# Patient Record
Sex: Female | Born: 1960 | Race: White | Hispanic: No | Marital: Single | Smoking: Never smoker
Health system: Southern US, Community
[De-identification: ages and names within clinical notes are randomized; demographics above are authoritative.]

## PROBLEM LIST (undated history)

## (undated) DIAGNOSIS — M545 Low back pain, unspecified: Secondary | ICD-10-CM

## (undated) DIAGNOSIS — G8929 Other chronic pain: Secondary | ICD-10-CM

## (undated) HISTORY — PX: ABDOMINAL HYSTERECTOMY: SHX81

---

## 2007-10-20 ENCOUNTER — Encounter: Admission: RE | Admit: 2007-10-20 | Discharge: 2007-10-20 | Payer: Self-pay | Admitting: Gastroenterology

## 2007-10-30 ENCOUNTER — Ambulatory Visit: Payer: Self-pay | Admitting: Surgery

## 2007-11-06 ENCOUNTER — Ambulatory Visit: Payer: Self-pay | Admitting: Surgery

## 2007-11-16 ENCOUNTER — Ambulatory Visit (HOSPITAL_COMMUNITY): Admission: RE | Admit: 2007-11-16 | Discharge: 2007-11-16 | Payer: Self-pay | Admitting: Obstetrics and Gynecology

## 2009-07-10 ENCOUNTER — Emergency Department (HOSPITAL_BASED_OUTPATIENT_CLINIC_OR_DEPARTMENT_OTHER): Admission: EM | Admit: 2009-07-10 | Discharge: 2009-07-10 | Payer: Self-pay | Admitting: Emergency Medicine

## 2010-12-15 NOTE — Assessment & Plan Note (Signed)
OFFICE VISIT   GEARLINE, SPILMAN  DOB:  15-Feb-1961                                       10/30/2007  [id number]#:[id number]   REASON FOR VISIT:  Bilateral leg swelling.   This is a 50 year old female seen at the request of Dr. Billy Coast for  evaluation of bilateral lower extremity swelling.  The patient states  that for approximately 1 month she was having bilateral leg swelling.  This initially began with severe swelling in her right ankle and now  also is in the left leg with pain in the left calf.  She says that  elevation does help.  She describes an occasional shooting pain.  She  has gotten to where she cannot tolerate any form of clothing around her  legs including socks.  She does not have a history of DVT, she has never  had a pulmonary embolism.  She does not have a family history of DVT,  although she states her sister, who smokes, may have had a blood clot.  Otherwise, she is doing well.  She does not have any ulcers on her legs.   REVIEW OF SYSTEMS:  GENERAL:  Positive for weight gain.  CARDIAC:  Occasional chest tightness.  PULMONARY:  Negative.  GI:  Positive for blood in her stool and reflux and diarrhea.  GU:  Negative.  VASCULAR:  Pain when lying flat.  NEURO:  Positive for dizziness and headaches.  ORTHO:  Negative.  PSYCH:  Negative.  ENT:  Negative.  HEME:  Positive for anemia.   PAST MEDICAL HISTORY:  Negative.   FAMILY HISTORY:  Noncontributory.   SOCIAL HISTORY:  1. She is single.  2. Works in administration.  3. She does not smoke, she has never smoked.  4. She does not drink.   MEDICATIONS:  Zyrtec, Xifaxan and Align.   ALLERGIES:  1. CODEINE.  2. AMPICILLIN.  3. SULFA.   PHYSICAL EXAMINATION:  Vital Signs:  Heart rate 102, blood pressure  121/76, respirations 16.  General:  She is well-appearing, no acute  distress.  She is normocephalic, atraumatic.  Cardiovascular:  Regular  rate and rhythm.  Pulmonary:   Respirations are nonlabored.  Abdomen:  Soft.  Extremities:  Minimal edema at this time, no ulceration, palpable  pedal pulses.  Psych:  She is alert and oriented x3.  Skin:  Without  rash.   DIAGNOSTIC STUDIES:  The patient had Duplex evaluation today.  This  reveals no evidence of deep venous thrombosis bilaterally.  Patient does  have superficial venous thrombosis in the left greater saphenous vein  from the ankle to the proximal calf.  This is of undetermined age.   ASSESSMENT AND PLAN:  Bilateral leg swelling and left leg superficial  thrombosis.   Plan at this time:  I will have the patient follow up in 1 week to  determine if she has had propagation of her superficial venous  thrombosis.  If she has, I will place her on anticoagulation at that  time.  Also, in 1 week I will evaluate her with a reflux study to see if  she would be a candidate for saphenous vein ablation.  She is going to  follow up with me in 1 week.   Jorge Ny, MD  Electronically Signed   VWB/MEDQ  D:  10/30/2007  T:  10/31/2007  Job:  [id number]   cc:   Lenoard Aden, M.D.

## 2010-12-15 NOTE — Procedures (Signed)
DUPLEX DEEP VENOUS EXAM - LOWER EXTREMITY   INDICATION:  Bilateral lower extremity edema, right greater than left.  Patient complains of transient swelling of her calves and feet  bilaterally for approximately one month, which is somewhat relieved by  elevation.  Patient states that she has restless legs syndrome.   HISTORY:  Edema:  Yes.  Trauma/Surgery:  No.  Pain:  Occasional shooting pain down her legs.  Throbbing in her ankles.  PE:  No.  Previous DVT:  No.  Anticoagulants:  Other:   DUPLEX EXAM:                CFV   SFV   PopV  PTV    GSV                R  L  R  L  R  L  R   L  R  L  Thrombosis    o  o  o  o  o  o  o   o  o  +  Spontaneous   +  +  +  +  +  +  +   +  +  o  Phasic        +  +  +  +  +  +  +   +  +  o  Augmentation  +  +  +  +  +  +  +   +  +  o  Compressible  +  +  +  +  +  +  +   +  +  o  Competent   Legend:  + - yes  o - no  p - partial  D - decreased   IMPRESSION:  1. No evidence of deep venous thrombosis bilaterally.  2. Bilateral bifed superficial femoral vein without thrombus noted.  3. SVT noted in left greater saphenous vein from the level of the      ankle to the proximal calf of mixed age.  4. A focal thrombus in left lesser saphenous vein at the level of the      mid calf of undeterminable age.    _____________________________  V. Charlena Cross, MD   PB/MEDQ  D:  10/30/2007  T:  10/30/2007  Job:  [id number]

## 2010-12-15 NOTE — Procedures (Signed)
LOWER EXTREMITY VENOUS REFLUX EXAM   INDICATION:  Followup, left greater and lesser saphenous vein thrombus.   EXAM:  Using color-flow imaging and pulse Doppler spectral analysis, the  right and left common femoral, superficial femoral, popliteal, posterior  tibial, greater and lesser saphenous veins are evaluated.  There is no  evidence suggesting deep venous insufficiency in the right and left  lower extremity.   The right and left saphenofemoral junction is competent.  The right and  left GSV is competent with the caliber as described below.   The right and left proximal short saphenous vein demonstrates  competency.   GSV Diameter (used if found to be incompetent only)                                            Right    Left  Proximal Greater Saphenous Vein           cm       cm  Proximal-to-mid-thigh                     cm       cm  Mid thigh                                 cm       cm  Mid-distal thigh                          cm       cm  Distal thigh                              cm       cm  Knee                                      cm       cm   IMPRESSION:  1. The right and left greater saphenous veins are not aneurysmal.  2. The right and left greater saphenous veins are not tortuous.  3. The deep venous system is competent.  4. The right and left lesser saphenous veins are competent.  5. Thrombus is noted in the proximal calf portion of the greater      saphenous vein extending into the popliteal area.  6. Short saphenous vein thrombus is noted in the proximal calf, which      is unchanged from 10/30/07.      ___________________________________________  V. Charlena Cross, MD   DP/MEDQ  D:  11/06/2007  T:  11/06/2007  Job:  [id number]

## 2010-12-15 NOTE — Assessment & Plan Note (Signed)
OFFICE VISIT   Nicole [name], Nicole [name]  DOB:  Oct 21, 1960                                       11/06/2007  [id number]#:[id number]   REASON FOR VISIT:  Followup.   HISTORY:  This is a 50 year old female that is seen for evaluation of  bilateral lower extremity swelling.  She had a duplex that revealed no  evidence of deep venous thrombosis, but she did have saphenous vein  thrombosis in the left greater saphenous from the ankle to proximal  calf.  I had her come back to see me to make sure there had been no  thrombus propagation.  She has been doing well in the interim.  There  have been no acute changes.   PHYSICAL EXAMINATION:  Blood pressure is 118/81, pulse 74, respirations  16.  In general, she is in no acute distress.  There is no evidence of  erythema on her legs.  There is minimal swelling.  There are no  prominent varicosities.   DIAGNOSTIC STUDIES:  Duplex was repeated.  There is no evidence of DVT.  There is bilateral superficial femoral vein thrombus, which has not  propagated.  There is a focal thrombus in the left lesser saphenous vein  at the mid calf, which was present previously.   ASSESSMENT/PLAN:  Superficial venous thrombosis:  Given that this is not  propagated, I do not see any reason to place her on anticoagulation at  this time.  Given that she is having some trouble from swelling, I have  had her fitted for compression stockings, which will be thigh-high, for  her to wear.  I will have her follow up with me in three months to see  how she is doing.   Jorge Ny, MD  Electronically Signed   VWB/MEDQ  D:  11/06/2007  T:  11/07/2007  Job:  [id number]   cc:   Anselmo Rod, M.D.

## 2015-04-16 ENCOUNTER — Telehealth: Payer: Self-pay | Admitting: Podiatry

## 2015-04-16 NOTE — Telephone Encounter (Signed)
I left message for pt. To call to schedule appt

## 2015-04-22 ENCOUNTER — Ambulatory Visit: Payer: Self-pay | Admitting: Podiatry

## 2015-04-22 ENCOUNTER — Ambulatory Visit (INDEPENDENT_AMBULATORY_CARE_PROVIDER_SITE_OTHER): Payer: Managed Care, Other (non HMO)

## 2015-04-22 ENCOUNTER — Encounter: Payer: Self-pay | Admitting: Podiatry

## 2015-04-22 ENCOUNTER — Ambulatory Visit (INDEPENDENT_AMBULATORY_CARE_PROVIDER_SITE_OTHER): Payer: Managed Care, Other (non HMO) | Admitting: Podiatry

## 2015-04-22 VITALS — BP 131/81 | HR 90 | Resp 16

## 2015-04-22 DIAGNOSIS — M79672 Pain in left foot: Secondary | ICD-10-CM

## 2015-04-22 DIAGNOSIS — M79671 Pain in right foot: Secondary | ICD-10-CM | POA: Diagnosis not present

## 2015-04-22 DIAGNOSIS — M5410 Radiculopathy, site unspecified: Secondary | ICD-10-CM | POA: Diagnosis not present

## 2015-04-22 DIAGNOSIS — M722 Plantar fascial fibromatosis: Secondary | ICD-10-CM

## 2015-04-22 MED ORDER — METHYLPREDNISOLONE 4 MG PO TBPK
ORAL_TABLET | ORAL | Status: DC
Start: 2015-04-22 — End: 2015-05-20

## 2015-04-22 NOTE — Patient Instructions (Signed)

## 2015-04-22 NOTE — Progress Notes (Signed)
   Subjective:    Patient ID: Nicole [name], female    DOB: 1961-01-14, 54 y.o.   MRN: [id number]  HPI: She presents today with several weeks' duration of pain to her left heel. She states that she is starting to feel numbness and pain to her left plantar heel which is reminiscent of plantar fasciitis which she had before. She is very concerned about another issue where she wakes up in the mornings and has severe numbness from the plantar aspect of the tip of her toes to her ankles. She states that it feels like she is walking on [place] blocks and it is very uncomfortable and very painful. She states that this is equal and symmetrical bilateral. She states that he recently started after she has developed more pain in her back. She states that I have tried going back to my Lidoderm patch tramadol and meloxicam with little help.    Review of Systems  Constitutional: Positive for diaphoresis and fatigue.  HENT: Positive for sinus pressure.   Cardiovascular: Positive for leg swelling.  Neurological: Positive for dizziness and headaches.  All other systems reviewed and are negative.      Objective:   Physical Exam: 54 year old female vital signs stable alert and oriented 3 pulses are strongly palpable. Neurologic sensorium is intact per Semmes-Weinstein monofilament deep tendon reflexes are intact bilateral and muscle strength +5 over 5 dorsiflexion plantar flexors and inverters everters all intrinsic musculature is intact. Orthopedic evaluation demonstrates all joints distal to the ankle for range of motion without crepitation. She has pain on palpation medial calcaneal tubercle of her left heel. Radiographs of the left foot demonstrates a soft tissue increase in density at the plantar fascial calcaneal insertion site of the left heel. Cutaneous evaluation demonstrates supple well-hydrated cutis no erythema edema cellulitis drainage or odor.      Assessment & Plan:  Plantar fasciitis left foot.  Neuropathy or radiculopathy associated with back issues most likely resulting in the early morning numbness of her feet.  Plan: Discussed etiology pathology conservative versus surgical therapies. Placed her on a sterilely Dosepak to be followed by meloxicam. Injected her left heel today put her in a plantar fascial brace and a night splint. She will continue the use of her other medications as needed. I did suggest that she follow up with her neurosurgeon as soon as possible. I will follow up with her in 1 month.

## 2015-05-20 ENCOUNTER — Encounter: Payer: Self-pay | Admitting: Podiatry

## 2015-05-20 ENCOUNTER — Ambulatory Visit (INDEPENDENT_AMBULATORY_CARE_PROVIDER_SITE_OTHER): Payer: Managed Care, Other (non HMO) | Admitting: Podiatry

## 2015-05-20 VITALS — BP 129/87 | HR 88 | Resp 16

## 2015-05-20 DIAGNOSIS — M5410 Radiculopathy, site unspecified: Secondary | ICD-10-CM

## 2015-05-20 DIAGNOSIS — M722 Plantar fascial fibromatosis: Secondary | ICD-10-CM

## 2015-05-20 NOTE — Progress Notes (Signed)
She presents today for follow-up of her radiculopathy plantar fasciitis left foot. She states that she is a proximal 80% improved.  Objective: Vital signs are stable alert and oriented 3. Pulses are strongly palpable. Neurologic sensorium is intact per Semmes-Weinstein monofilament. She has pain on palpation medial calcaneal tubercle.  Assessment: Plantar fasciitis left.  Plan: Discussed etiology pathology conservative versus surgical therapies. I reinjected the left heel today encouraged her to continue to wear try to wear her night splint that she has not been wearing it. I encouraged her to continue all of her anti-inflammatories and we'll follow-up with her in 1 month.

## 2016-04-19 DIAGNOSIS — F411 Generalized anxiety disorder: Secondary | ICD-10-CM | POA: Insufficient documentation

## 2016-04-19 DIAGNOSIS — G4700 Insomnia, unspecified: Secondary | ICD-10-CM | POA: Insufficient documentation

## 2016-04-20 DIAGNOSIS — M5416 Radiculopathy, lumbar region: Secondary | ICD-10-CM | POA: Insufficient documentation

## 2016-04-20 DIAGNOSIS — M461 Sacroiliitis, not elsewhere classified: Secondary | ICD-10-CM | POA: Insufficient documentation

## 2016-04-20 DIAGNOSIS — M519 Unspecified thoracic, thoracolumbar and lumbosacral intervertebral disc disorder: Secondary | ICD-10-CM | POA: Insufficient documentation

## 2016-07-01 DIAGNOSIS — F32A Depression, unspecified: Secondary | ICD-10-CM | POA: Insufficient documentation

## 2016-10-17 ENCOUNTER — Emergency Department (HOSPITAL_BASED_OUTPATIENT_CLINIC_OR_DEPARTMENT_OTHER)
Admission: EM | Admit: 2016-10-17 | Discharge: 2016-10-17 | Disposition: A | Discharge status: Home / Self Care | Payer: 59 | Attending: Emergency Medicine | Admitting: Emergency Medicine

## 2016-10-17 ENCOUNTER — Encounter (HOSPITAL_BASED_OUTPATIENT_CLINIC_OR_DEPARTMENT_OTHER): Payer: Self-pay | Admitting: Emergency Medicine

## 2016-10-17 ENCOUNTER — Emergency Department (HOSPITAL_BASED_OUTPATIENT_CLINIC_OR_DEPARTMENT_OTHER): Payer: 59

## 2016-10-17 DIAGNOSIS — M25551 Pain in right hip: Secondary | ICD-10-CM | POA: Insufficient documentation

## 2016-10-17 DIAGNOSIS — M545 Low back pain, unspecified: Secondary | ICD-10-CM

## 2016-10-17 DIAGNOSIS — Y999 Unspecified external cause status: Secondary | ICD-10-CM | POA: Insufficient documentation

## 2016-10-17 DIAGNOSIS — Y9389 Activity, other specified: Secondary | ICD-10-CM | POA: Insufficient documentation

## 2016-10-17 DIAGNOSIS — Y92410 Unspecified street and highway as the place of occurrence of the external cause: Secondary | ICD-10-CM | POA: Insufficient documentation

## 2016-10-17 DIAGNOSIS — S3992XA Unspecified injury of lower back, initial encounter: Secondary | ICD-10-CM | POA: Diagnosis present

## 2016-10-17 HISTORY — DX: Low back pain: M54.5

## 2016-10-17 HISTORY — DX: Low back pain, unspecified: M54.50

## 2016-10-17 HISTORY — DX: Other chronic pain: G89.29

## 2016-10-17 MED ORDER — METHOCARBAMOL 500 MG PO TABS
500.0000 mg | ORAL_TABLET | Freq: Once | ORAL | Status: AC
  Administered 2016-10-17: 500 mg via ORAL
  Filled 2016-10-17: qty 1

## 2016-10-17 MED ORDER — TRAMADOL HCL 50 MG PO TABS: 50.0000 mg | ORAL_TABLET | Freq: Four times a day (QID) | ORAL | 0 refills | Status: DC | PRN

## 2016-10-17 MED ORDER — TRAMADOL HCL 50 MG PO TABS
50.0000 mg | ORAL_TABLET | Freq: Once | ORAL | Status: AC
  Administered 2016-10-17: 50 mg via ORAL
  Filled 2016-10-17: qty 1

## 2016-10-17 MED ORDER — METHOCARBAMOL 500 MG PO TABS: 500.0000 mg | ORAL_TABLET | Freq: Two times a day (BID) | ORAL | 0 refills | Status: AC

## 2016-10-17 NOTE — ED Notes (Signed)
Pt given d/c instructions as per chart. Verbalizes understanding. No questions. Rx x 2 with precautions. 
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
## 2018-07-03 DIAGNOSIS — M461 Sacroiliitis, not elsewhere classified: Secondary | ICD-10-CM | POA: Insufficient documentation

## 2018-08-06 ENCOUNTER — Observation Stay
Admission: EM | Admit: 2018-08-06 | Discharge: 2018-08-07 | Disposition: A | Discharge status: Home / Self Care | Payer: BLUE CROSS/BLUE SHIELD | Attending: General Surgery | Admitting: General Surgery

## 2018-08-06 ENCOUNTER — Emergency Department: Payer: BLUE CROSS/BLUE SHIELD

## 2018-08-06 ENCOUNTER — Encounter
Admission: EM | Disposition: A | Discharge status: Home / Self Care | Payer: Self-pay | Source: Home / Self Care | Attending: Emergency Medicine

## 2018-08-06 ENCOUNTER — Observation Stay: Payer: BLUE CROSS/BLUE SHIELD | Admitting: Anesthesiology

## 2018-08-06 ENCOUNTER — Encounter: Payer: Self-pay | Admitting: Radiology

## 2018-08-06 ENCOUNTER — Other Ambulatory Visit: Payer: Self-pay

## 2018-08-06 DIAGNOSIS — K352 Acute appendicitis with generalized peritonitis, without abscess: Principal | ICD-10-CM | POA: Insufficient documentation

## 2018-08-06 DIAGNOSIS — Z885 Allergy status to narcotic agent status: Secondary | ICD-10-CM | POA: Insufficient documentation

## 2018-08-06 DIAGNOSIS — M5126 Other intervertebral disc displacement, lumbar region: Secondary | ICD-10-CM | POA: Insufficient documentation

## 2018-08-06 DIAGNOSIS — Z791 Long term (current) use of non-steroidal anti-inflammatories (NSAID): Secondary | ICD-10-CM | POA: Insufficient documentation

## 2018-08-06 DIAGNOSIS — K219 Gastro-esophageal reflux disease without esophagitis: Secondary | ICD-10-CM | POA: Diagnosis not present

## 2018-08-06 DIAGNOSIS — Z79899 Other long term (current) drug therapy: Secondary | ICD-10-CM | POA: Diagnosis not present

## 2018-08-06 DIAGNOSIS — Z90710 Acquired absence of both cervix and uterus: Secondary | ICD-10-CM | POA: Diagnosis not present

## 2018-08-06 DIAGNOSIS — Z880 Allergy status to penicillin: Secondary | ICD-10-CM | POA: Insufficient documentation

## 2018-08-06 DIAGNOSIS — K3580 Unspecified acute appendicitis: Secondary | ICD-10-CM | POA: Diagnosis present

## 2018-08-06 HISTORY — PX: LAPAROSCOPIC APPENDECTOMY: SHX408

## 2018-08-06 LAB — COMPREHENSIVE METABOLIC PANEL
ALBUMIN: 4.4 g/dL (ref 3.5–5.0)
ALK PHOS: 77 U/L (ref 38–126)
ALT: 15 U/L (ref 0–44)
ANION GAP: 8 (ref 5–15)
AST: 15 U/L (ref 15–41)
BUN: 13 mg/dL (ref 6–20)
CHLORIDE: 100 mmol/L (ref 98–111)
CO2: 26 mmol/L (ref 22–32)
Calcium: 9.3 mg/dL (ref 8.9–10.3)
Creatinine, Ser: 0.78 mg/dL (ref 0.44–1.00)
GFR calc non Af Amer: >60 mL/min (ref >60)
GLUCOSE: 163 mg/dL — AB (ref 70–99)
POTASSIUM: 3.5 mmol/L (ref 3.5–5.1)
SODIUM: 134 mmol/L — AB (ref 135–145)
Total Bilirubin: 1.4 mg/dL — ABNORMAL HIGH (ref 0.3–1.2)
Total Protein: 7.2 g/dL (ref 6.5–8.1)

## 2018-08-06 LAB — URINALYSIS, COMPLETE (UACMP) WITH MICROSCOPIC
BACTERIA UA: NONE SEEN
Bilirubin Urine: NEGATIVE
Glucose, UA: NEGATIVE mg/dL
Hgb urine dipstick: NEGATIVE
KETONES UR: NEGATIVE mg/dL
LEUKOCYTES UA: NEGATIVE
NITRITE: NEGATIVE
PROTEIN: NEGATIVE mg/dL
Specific Gravity, Urine: 1.011 (ref 1.005–1.030)
pH: 7 (ref 5.0–8.0)

## 2018-08-06 LAB — CBC
HEMATOCRIT: 46.6 % — AB (ref 36.0–46.0)
Hemoglobin: 16.5 g/dL — ABNORMAL HIGH (ref 12.0–15.0)
MCH: 31.2 pg (ref 26.0–34.0)
MCHC: 35.4 g/dL (ref 30.0–36.0)
MCV: 88.1 fL (ref 80.0–100.0)
NRBC: 0 % (ref 0.0–0.2)
Platelets: 201 10*3/uL (ref 150–400)
RBC: 5.29 MIL/uL — AB (ref 3.87–5.11)
RDW: 12 % (ref 11.5–15.5)
WBC: 22.3 10*3/uL — ABNORMAL HIGH (ref 4.0–10.5)

## 2018-08-06 LAB — SURGICAL PCR SCREEN
MRSA, PCR: NEGATIVE
Staphylococcus aureus: NEGATIVE

## 2018-08-06 LAB — LIPASE, BLOOD: Lipase: 24 U/L (ref 11–51)

## 2018-08-06 SURGERY — APPENDECTOMY, LAPAROSCOPIC
Anesthesia: General

## 2018-08-06 MED ORDER — DEXAMETHASONE SODIUM PHOSPHATE 10 MG/ML IJ SOLN
INTRAMUSCULAR | Status: AC
  Filled 2018-08-06: qty 1

## 2018-08-06 MED ORDER — LIDOCAINE HCL (PF) 2 % IJ SOLN
INTRAMUSCULAR | Status: AC
  Filled 2018-08-06: qty 10

## 2018-08-06 MED ORDER — CIPROFLOXACIN IN D5W 400 MG/200ML IV SOLN
400.0000 mg | INTRAVENOUS | Status: AC
  Administered 2018-08-06: 400 mg via INTRAVENOUS
  Filled 2018-08-06: qty 200

## 2018-08-06 MED ORDER — IOPAMIDOL (ISOVUE-300) INJECTION 61%
100.0000 mL | Freq: Once | INTRAVENOUS | Status: AC | PRN
  Administered 2018-08-06: 100 mL via INTRAVENOUS

## 2018-08-06 MED ORDER — DEXAMETHASONE SODIUM PHOSPHATE 10 MG/ML IJ SOLN
INTRAMUSCULAR | Status: DC | PRN
  Administered 2018-08-06: 10 mg via INTRAVENOUS

## 2018-08-06 MED ORDER — HYDROMORPHONE HCL 1 MG/ML IJ SOLN
0.5000 mg | INTRAMUSCULAR | Status: DC | PRN
  Administered 2018-08-06 (×2): 0.5 mg via INTRAVENOUS
  Filled 2018-08-06 (×2): qty 0.5

## 2018-08-06 MED ORDER — ONDANSETRON HCL 4 MG/2ML IJ SOLN
4.0000 mg | Freq: Four times a day (QID) | INTRAMUSCULAR | Status: DC | PRN
  Administered 2018-08-06 (×2): 4 mg via INTRAVENOUS
  Filled 2018-08-06 (×2): qty 2

## 2018-08-06 MED ORDER — OXYCODONE HCL 5 MG PO TABS
5.0000 mg | ORAL_TABLET | ORAL | Status: DC | PRN
  Administered 2018-08-07: 5 mg via ORAL
  Filled 2018-08-06: qty 1

## 2018-08-06 MED ORDER — FENTANYL CITRATE (PF) 100 MCG/2ML IJ SOLN
INTRAMUSCULAR | Status: AC
  Filled 2018-08-06: qty 2

## 2018-08-06 MED ORDER — SODIUM CHLORIDE 0.9 % IV BOLUS
1000.0000 mL | Freq: Once | INTRAVENOUS | Status: AC
  Administered 2018-08-06: 1000 mL via INTRAVENOUS

## 2018-08-06 MED ORDER — LIDOCAINE-EPINEPHRINE (PF) 1 %-1:200000 IJ SOLN
INTRAMUSCULAR | Status: AC
  Filled 2018-08-06: qty 30

## 2018-08-06 MED ORDER — IOPAMIDOL (ISOVUE-300) INJECTION 61%
30.0000 mL | Freq: Once | INTRAVENOUS | Status: AC | PRN
  Administered 2018-08-06: 30 mL via ORAL

## 2018-08-06 MED ORDER — LIDOCAINE HCL (CARDIAC) PF 100 MG/5ML IV SOSY
PREFILLED_SYRINGE | INTRAVENOUS | Status: DC | PRN
  Administered 2018-08-06: 100 mg via INTRAVENOUS

## 2018-08-06 MED ORDER — PROMETHAZINE HCL 25 MG/ML IJ SOLN: 6.2500 mg | INTRAMUSCULAR | Status: DC | PRN

## 2018-08-06 MED ORDER — SUCCINYLCHOLINE CHLORIDE 20 MG/ML IJ SOLN
INTRAMUSCULAR | Status: DC | PRN
  Administered 2018-08-06: 100 mg via INTRAVENOUS

## 2018-08-06 MED ORDER — PROMETHAZINE HCL 25 MG/ML IJ SOLN
12.5000 mg | Freq: Four times a day (QID) | INTRAMUSCULAR | Status: DC | PRN
  Administered 2018-08-06: 12.5 mg via INTRAVENOUS
  Filled 2018-08-06: qty 1

## 2018-08-06 MED ORDER — METRONIDAZOLE IN NACL 5-0.79 MG/ML-% IV SOLN
500.0000 mg | INTRAVENOUS | Status: AC
  Administered 2018-08-06: 500 mg via INTRAVENOUS
  Filled 2018-08-06: qty 100

## 2018-08-06 MED ORDER — ACETAMINOPHEN 10 MG/ML IV SOLN
INTRAVENOUS | Status: DC | PRN
  Administered 2018-08-06: 1000 mg via INTRAVENOUS

## 2018-08-06 MED ORDER — MIDAZOLAM HCL 2 MG/2ML IJ SOLN
INTRAMUSCULAR | Status: DC | PRN
  Administered 2018-08-06: 2 mg via INTRAVENOUS

## 2018-08-06 MED ORDER — ONDANSETRON HCL 4 MG/2ML IJ SOLN
INTRAMUSCULAR | Status: AC
  Filled 2018-08-06: qty 2

## 2018-08-06 MED ORDER — ROCURONIUM BROMIDE 50 MG/5ML IV SOLN
INTRAVENOUS | Status: AC
  Filled 2018-08-06: qty 1

## 2018-08-06 MED ORDER — FENTANYL CITRATE (PF) 100 MCG/2ML IJ SOLN
100.0000 ug | Freq: Once | INTRAMUSCULAR | Status: AC
  Administered 2018-08-06: 100 ug via INTRAVENOUS
  Filled 2018-08-06: qty 2

## 2018-08-06 MED ORDER — MUPIROCIN 2 % EX OINT
1.0000 "application " | TOPICAL_OINTMENT | Freq: Two times a day (BID) | CUTANEOUS | Status: DC
  Filled 2018-08-06: qty 22

## 2018-08-06 MED ORDER — ACETAMINOPHEN 500 MG PO TABS: 1000.0000 mg | ORAL_TABLET | Freq: Four times a day (QID) | ORAL | Status: DC | PRN

## 2018-08-06 MED ORDER — BUPIVACAINE HCL (PF) 0.25 % IJ SOLN
INTRAMUSCULAR | Status: AC
  Filled 2018-08-06: qty 30

## 2018-08-06 MED ORDER — LACTATED RINGERS IV SOLN
INTRAVENOUS | Status: DC
  Administered 2018-08-06 – 2018-08-07 (×3): via INTRAVENOUS

## 2018-08-06 MED ORDER — PROPOFOL 10 MG/ML IV BOLUS
INTRAVENOUS | Status: AC
  Filled 2018-08-06: qty 20

## 2018-08-06 MED ORDER — ROCURONIUM BROMIDE 100 MG/10ML IV SOLN
INTRAVENOUS | Status: DC | PRN
  Administered 2018-08-06: 10 mg via INTRAVENOUS
  Administered 2018-08-06: 40 mg via INTRAVENOUS
  Administered 2018-08-06: 10 mg via INTRAVENOUS

## 2018-08-06 MED ORDER — IOHEXOL 300 MG/ML  SOLN: 100.0000 mL | Freq: Once | INTRAMUSCULAR | Status: DC | PRN

## 2018-08-06 MED ORDER — FENTANYL CITRATE (PF) 100 MCG/2ML IJ SOLN
50.0000 ug | Freq: Once | INTRAMUSCULAR | Status: AC
  Administered 2018-08-06: 50 ug via INTRAVENOUS
  Filled 2018-08-06: qty 2

## 2018-08-06 MED ORDER — SUGAMMADEX SODIUM 200 MG/2ML IV SOLN
INTRAVENOUS | Status: AC
  Filled 2018-08-06: qty 2

## 2018-08-06 MED ORDER — MIDAZOLAM HCL 2 MG/2ML IJ SOLN
INTRAMUSCULAR | Status: AC
  Filled 2018-08-06: qty 2

## 2018-08-06 MED ORDER — ONDANSETRON HCL 4 MG/2ML IJ SOLN
4.0000 mg | Freq: Once | INTRAMUSCULAR | Status: AC
  Administered 2018-08-06: 4 mg via INTRAVENOUS
  Filled 2018-08-06: qty 2

## 2018-08-06 MED ORDER — ACETAMINOPHEN 10 MG/ML IV SOLN
INTRAVENOUS | Status: AC
  Filled 2018-08-06: qty 100

## 2018-08-06 MED ORDER — LIDOCAINE-EPINEPHRINE (PF) 1 %-1:200000 IJ SOLN
INTRAMUSCULAR | Status: DC | PRN
  Administered 2018-08-06: 20 mL

## 2018-08-06 MED ORDER — SUCCINYLCHOLINE CHLORIDE 20 MG/ML IJ SOLN
INTRAMUSCULAR | Status: AC
  Filled 2018-08-06: qty 1

## 2018-08-06 MED ORDER — FENTANYL CITRATE (PF) 100 MCG/2ML IJ SOLN
INTRAMUSCULAR | Status: DC | PRN
  Administered 2018-08-06 (×4): 50 ug via INTRAVENOUS

## 2018-08-06 MED ORDER — SUGAMMADEX SODIUM 200 MG/2ML IV SOLN
INTRAVENOUS | Status: DC | PRN
  Administered 2018-08-06: 180 mg via INTRAVENOUS

## 2018-08-06 MED ORDER — PROPOFOL 10 MG/ML IV BOLUS
INTRAVENOUS | Status: DC | PRN
  Administered 2018-08-06: 160 mg via INTRAVENOUS

## 2018-08-06 MED ORDER — FENTANYL CITRATE (PF) 100 MCG/2ML IJ SOLN: 25.0000 ug | INTRAMUSCULAR | Status: DC | PRN

## 2018-08-06 MED ORDER — ONDANSETRON HCL 4 MG/2ML IJ SOLN
INTRAMUSCULAR | Status: DC | PRN
  Administered 2018-08-06: 4 mg via INTRAVENOUS

## 2018-08-06 SURGICAL SUPPLY — 46 items
BLADE SURG SZ11 CARB STEEL (BLADE) ×2 IMPLANT
CANISTER SUCT 1200ML W/VALVE (MISCELLANEOUS) ×2 IMPLANT
CHLORAPREP W/TINT 26ML (MISCELLANEOUS) ×2 IMPLANT
COVER WAND RF STERILE (DRAPES) IMPLANT
CUTTER FLEX LINEAR 45M (STAPLE) ×2 IMPLANT
DEFOGGER SCOPE WARMER CLEARIFY (MISCELLANEOUS) ×2 IMPLANT
DERMABOND ADVANCED (GAUZE/BANDAGES/DRESSINGS) ×1
DERMABOND ADVANCED .7 DNX12 (GAUZE/BANDAGES/DRESSINGS) ×1 IMPLANT
ELECT CAUTERY BLADE 6.4 (BLADE) ×2 IMPLANT
ELECT CAUTERY BLADE TIP 2.5 (TIP) ×2
ELECT REM PT RETURN 9FT ADLT (ELECTROSURGICAL) ×2
ELECTRODE CAUTERY BLDE TIP 2.5 (TIP) ×1 IMPLANT
ELECTRODE REM PT RTRN 9FT ADLT (ELECTROSURGICAL) ×1 IMPLANT
GLOVE BIO SURGEON STRL SZ 6.5 (GLOVE) ×4 IMPLANT
GLOVE BIOGEL PI IND STRL 7.0 (GLOVE) ×2 IMPLANT
GLOVE BIOGEL PI INDICATOR 7.0 (GLOVE) ×2
GOWN STRL REUS W/ TWL LRG LVL3 (GOWN DISPOSABLE) ×2 IMPLANT
GOWN STRL REUS W/TWL LRG LVL3 (GOWN DISPOSABLE) ×2
IRRIGATION STRYKERFLOW (MISCELLANEOUS) ×1 IMPLANT
IRRIGATOR STRYKERFLOW (MISCELLANEOUS) ×2
IV NS 1000ML (IV SOLUTION) ×1
IV NS 1000ML BAXH (IV SOLUTION) ×1 IMPLANT
KIT TURNOVER KIT A (KITS) ×2 IMPLANT
LABEL OR SOLS (LABEL) IMPLANT
LIGASURE MARYLAND LAP STAND (ELECTROSURGICAL) IMPLANT
NEEDLE HYPO 22GX1.5 SAFETY (NEEDLE) ×2 IMPLANT
NS IRRIG 500ML POUR BTL (IV SOLUTION) ×2 IMPLANT
PACK LAP CHOLECYSTECTOMY (MISCELLANEOUS) ×2 IMPLANT
PENCIL ELECTRO HAND CTR (MISCELLANEOUS) ×2 IMPLANT
POUCH SPECIMEN RETRIEVAL 10MM (ENDOMECHANICALS) ×2 IMPLANT
RELOAD 45 VASCULAR/THIN (ENDOMECHANICALS) ×2 IMPLANT
RELOAD STAPLE TA45 3.5 REG BLU (ENDOMECHANICALS) ×2 IMPLANT
SCISSORS METZENBAUM CVD 33 (INSTRUMENTS) ×2 IMPLANT
SHEARS HARMONIC ACE PLUS 36CM (ENDOMECHANICALS) ×2 IMPLANT
SLEEVE ADV FIXATION 5X100MM (TROCAR) ×4 IMPLANT
STRIP CLOSURE SKIN 1/2X4 (GAUZE/BANDAGES/DRESSINGS) ×2 IMPLANT
SUT MNCRL 4-0 (SUTURE) ×1
SUT MNCRL 4-0 27XMFL (SUTURE) ×1
SUT VIC AB 3-0 SH 27 (SUTURE) ×1
SUT VIC AB 3-0 SH 27X BRD (SUTURE) ×1 IMPLANT
SUT VICRYL 0 AB UR-6 (SUTURE) ×2 IMPLANT
SUTURE MNCRL 4-0 27XMF (SUTURE) ×1 IMPLANT
TRAY FOLEY MTR SLVR 16FR STAT (SET/KITS/TRAYS/PACK) ×2 IMPLANT
TROCAR 130MM GELPORT  DAV (MISCELLANEOUS) ×2 IMPLANT
TROCAR Z-THREAD OPTICAL 5X100M (TROCAR) ×2 IMPLANT
TUBING INSUFFLATION (TUBING) ×2 IMPLANT

## 2018-08-06 NOTE — Progress Notes (Signed)
Dr Lady Gary notified of pt vomiting orders given.

## 2018-08-06 NOTE — Anesthesia Postprocedure Evaluation (Signed)
Anesthesia Post Note  Patient: Nicole [name]  Procedure(s) Performed: APPENDECTOMY LAPAROSCOPIC (N/A )  Patient location during evaluation: PACU Anesthesia Type: General Level of consciousness: awake and alert Pain management: pain level controlled Vital Signs Assessment: post-procedure vital signs reviewed and stable Respiratory status: spontaneous breathing, nonlabored ventilation, respiratory function stable and patient connected to nasal cannula oxygen Cardiovascular status: blood pressure returned to baseline and stable Postop Assessment: no apparent nausea or vomiting Anesthetic complications: no     Last Vitals:  Vitals:   08/06/18 1748 08/06/18 1836  BP: 112/75 133/84  Pulse: (!) 105 86  Resp: 19 16  Temp:  (!) 36.3 C  SpO2: 99% 98%    Last Pain:  Vitals:   08/06/18 1836  TempSrc: Oral  PainSc:                  Lenard Simmer

## 2018-08-06 NOTE — ED Notes (Signed)
Pt transported to room 227 

## 2018-08-06 NOTE — Anesthesia Post-op Follow-up Note (Signed)
Anesthesia QCDR form completed.        

## 2018-08-06 NOTE — Anesthesia Preprocedure Evaluation (Signed)
Anesthesia Evaluation  Patient identified by MRN, date of birth, ID band Patient awake    Reviewed: Allergy & Precautions, H&P , NPO status , Patient's Chart, lab work & pertinent test results, reviewed documented beta blocker date and time   History of Anesthesia Complications (+) PONV and history of anesthetic complications  Airway Mallampati: II  TM Distance: >3 FB Neck ROM: full    Dental  (+) Dental Advidsory Given, Caps, Missing   Pulmonary neg pulmonary ROS,           Cardiovascular Exercise Tolerance: Good negative cardio ROS       Neuro/Psych negative neurological ROS  negative psych ROS   GI/Hepatic Neg liver ROS, GERD  ,  Endo/Other  negative endocrine ROS  Renal/GU negative Renal ROS  negative genitourinary   Musculoskeletal   Abdominal   Peds  Hematology negative hematology ROS (+)   Anesthesia Other Findings Past Medical History: No date: Chronic low back pain   Reproductive/Obstetrics negative OB ROS                             Anesthesia Physical Anesthesia Plan  ASA: II  Anesthesia Plan: General   Post-op Pain Management:    Induction: Intravenous  PONV Risk Score and Plan: 4 or greater and Ondansetron, Dexamethasone, Midazolam, Promethazine and Treatment may vary due to age or medical condition  Airway Management Planned: Oral ETT  Additional Equipment:   Intra-op Plan:   Post-operative Plan: Extubation in OR  Informed Consent: I have reviewed the patients History and Physical, chart, labs and discussed the procedure including the risks, benefits and alternatives for the proposed anesthesia with the patient or authorized representative who has indicated his/her understanding and acceptance.   Dental Advisory Given  Plan Discussed with: Anesthesiologist, CRNA and Surgeon  Anesthesia Plan Comments:         Anesthesia Quick Evaluation

## 2018-08-06 NOTE — ED Triage Notes (Signed)
Pt states that her abd pain started yesterday at her epigastric area, pt reports bloating and severe pain that is stabbing and moved to her rlq

## 2018-08-06 NOTE — Anesthesia Procedure Notes (Signed)
Procedure Name: Intubation Date/Time: 08/06/2018 3:00 PM Performed by: Aline Brochure, CRNA Pre-anesthesia Checklist: Patient identified, Emergency Drugs available, Suction available and Patient being monitored Patient Re-evaluated:Patient Re-evaluated prior to induction Oxygen Delivery Method: Circle system utilized Preoxygenation: Pre-oxygenation with 100% oxygen Induction Type: IV induction Ventilation: Mask ventilation without difficulty Laryngoscope Size: Mac and 3 Grade View: Grade I Tube type: Oral Tube size: 7.0 mm Number of attempts: 1 Airway Equipment and Method: Stylet Placement Confirmation: ETT inserted through vocal cords under direct vision,  positive ETCO2 and breath sounds checked- equal and bilateral Secured at: 22 cm Tube secured with: Tape Dental Injury: Teeth and Oropharynx as per pre-operative assessment

## 2018-08-06 NOTE — ED Provider Notes (Signed)
[facility] Emergency Department Provider Note  Time seen: 8:32 AM  I have reviewed the triage vital signs and the nursing notes.   HISTORY  Chief Complaint Abdominal Pain    HPI Nicole [name] is a 58 y.o. female with a past medical history of chronic back pain, presents to the emergency department for lower abdominal pain.  According to the patient since last night she has been experiencing aching pain in her upper abdomen and mid abdomen.  However states this morning it has progressed has become more severe and has located itself in the right lower quadrant.  Denies any nausea or vomiting.  Has had loose stool since yesterday.  Denies any dysuria or hematuria, vaginal bleeding or discharge.  No known fever.  Patient's only abdominal surgery in the past is a hysterectomy.   Past Medical History:  Diagnosis Date  . Chronic low back pain     There are no active problems to display for this patient.   Past Surgical History:  Procedure Laterality Date  . ABDOMINAL HYSTERECTOMY      Prior to Admission medications   Medication Sig Start Date End Date Taking? Authorizing Provider  methocarbamol (ROBAXIN) 500 MG tablet Take 1 tablet (500 mg total) by mouth 2 (two) times daily. 10/17/16   Law, Waylan Boga, PA-C  traMADol (ULTRAM) 50 MG tablet Take 1 tablet (50 mg total) by mouth every 6 (six) hours as needed. 10/17/16   Emi Holes, PA-C    Allergies  Allergen Reactions  . Ampicillin   . Codeine   . Hydrocodone-Acetaminophen   . Penicillins     No family history on file.  Social History Social History   Tobacco Use  . Smoking status: Never Smoker  . Smokeless tobacco: Never Used  Substance Use Topics  . Alcohol use: No    Alcohol/week: 0.0 standard drinks  . Drug use: Not on file    Review of Systems Constitutional: Negative for fever. Cardiovascular: Negative for chest pain. Respiratory: Negative for shortness of  breath. Gastrointestinal: Positive for abdominal pain initially mid abdominal now right lower quadrant.  Negative for nausea vomiting.  Positive for loose stool. Genitourinary: Negative for urinary compaints Musculoskeletal: Negative for musculoskeletal complaints Skin: Negative for skin complaints  Neurological: Negative for headache All other ROS negative  ____________________________________________   PHYSICAL EXAM:  VITAL SIGNS: ED Triage Vitals [08/06/18 0810]  Enc Vitals Group     BP (!) 139/101     Pulse Rate (!) 122     Resp (!) 22     Temp 98.4 F (36.9 C)     Temp Source Oral     SpO2 100 %     Weight      Height      Head Circumference      Peak Flow      Pain Score 7     Pain Loc      Pain Edu?      Excl. in GC?    Constitutional: Alert and oriented.  Mild distress due to abdominal pain. Eyes: Normal exam ENT   Head: Normocephalic and atraumatic   Mouth/Throat: Mucous membranes are moist. Cardiovascular: Normal rate, regular rhythm. No murmur Respiratory: Normal respiratory effort without tachypnea nor retractions. Breath sounds are clear Gastrointestinal: Soft, moderate right-sided tenderness, right lower quadrant greater than right upper quadrant.  Minimal rebound tenderness.  No guarding. Musculoskeletal: Nontender with normal range of motion in all extremities. Neurologic:  Normal speech  and language. No gross focal neurologic deficits Skin:  Skin is warm, dry and intact.  Psychiatric: Mood and affect are normal.   ____________________________________________    EKG  EKG viewed and interpreted by myself shows sinus tachycardia 117 bpm with a narrow QRS, normal axis, normal intervals, no concerning ST changes.  ____________________________________________    RADIOLOGY  CT consistent with acute appendicitis without perforation or abscess.  ____________________________________________   INITIAL IMPRESSION / ASSESSMENT AND PLAN / ED  COURSE  Pertinent labs & imaging results that were available during my care of the patient were reviewed by me and considered in my medical decision making (see chart for details).  Patient presents to the emergency department for right lower quadrant abdominal pain.  Differential at this time would include appendicitis, bowel obstruction, colitis/diverticulitis, cholecystitis, urinary tract infection/pyelonephritis.  We will check labs, urinalysis, treat pain nausea, IV hydrate and obtain CT imaging of the abdomen/pelvis to further evaluate.  Patient's white blood cell count is 22,000, the remainder of the lab work is pending.  Patient CT is consistent with acute appendicitis without perforation or abscess at this time.  I discussed the patient with Dr. Lady Garyannon of general surgery.  She will be down to see the patient.  Patient states she did have a couple blueberries this morning at 6 AM but has not eaten anything since.  ____________________________________________   FINAL CLINICAL IMPRESSION(S) / ED DIAGNOSES  Acute appendicitis   Minna AntisPaduchowski, Leonarda Leis, MD 08/06/18 1029

## 2018-08-06 NOTE — Progress Notes (Signed)
Ice chips given to patient.

## 2018-08-06 NOTE — H&P (Signed)
Nicole [name] is an 58 y.o. female.   Chief Complaint: acute appendicitis HPI: She presented to the emergency this morning with abdominal pain.  She reports initial onset in the upper and mid abdomen, particularly in the periumbilical area.  This started last night.  This morning, the pain is much more severe and his located itself in the right lower quadrant.  She denies any vomiting but did state that she had some nausea.  She denies fevers or chills.  She has a previous history of a partial hysterectomy.  She states that she last ate something at about 6:00 this morning which consisted of a few blueberries and a sip of Pepsi.  Past Medical History:  Diagnosis Date  . Chronic low back pain     Past Surgical History:  Procedure Laterality Date  . ABDOMINAL HYSTERECTOMY      No family history on file. Social History:  reports that she has never smoked. She has never used smokeless tobacco. She reports that she does not drink alcohol. No history on file for drug.  Allergies:  Allergies  Allergen Reactions  . Ampicillin Anaphylaxis  . Codeine     hallucinations  . Hydrocodone-Acetaminophen Nausea And Vomiting  . Penicillins Anaphylaxis and Hives     Results for orders placed or performed during the hospital encounter of 08/06/18 (from the past 48 hour(s))  Lipase, blood     Status: None   Collection Time: 08/06/18  8:15 AM  Result Value Ref Range   Lipase 24 11 - 51 U/L    Comment: Performed at [facility], [address] [place], [place] [address]  Comprehensive metabolic panel     Status: Abnormal   Collection Time: 08/06/18  8:15 AM  Result Value Ref Range   Sodium 134 (L) 135 - 145 mmol/L   Potassium 3.5 3.5 - 5.1 mmol/L   Chloride 100 98 - 111 mmol/L   CO2 26 22 - 32 mmol/L   Glucose, Bld 163 (H) 70 - 99 mg/dL   BUN 13 6 - 20 mg/dL   Creatinine, Ser 9.560.78 0.44 - 1.00 mg/dL   Calcium 9.3 8.9 - 21.310.3 mg/dL   Total Protein 7.2 6.5 - 8.1 g/dL   Albumin 4.4  3.5 - 5.0 g/dL   AST 15 15 - 41 U/L   ALT 15 0 - 44 U/L   Alkaline Phosphatase 77 38 - 126 U/L   Total Bilirubin 1.4 (H) 0.3 - 1.2 mg/dL   GFR calc non Af Amer >60 >60 mL/min   GFR calc Af Amer >60 >60 mL/min   Anion gap 8 5 - 15    Comment: Performed at [facility], [address] [place], [place] [address]  CBC     Status: Abnormal   Collection Time: 08/06/18  8:15 AM  Result Value Ref Range   WBC 22.3 (H) 4.0 - 10.5 K/uL   RBC 5.29 (H) 3.87 - 5.11 MIL/uL   Hemoglobin 16.5 (H) 12.0 - 15.0 g/dL   HCT 84.646.6 (H) 96.236.0 - 95.246.0 %   MCV 88.1 80.0 - 100.0 fL   MCH 31.2 26.0 - 34.0 pg   MCHC 35.4 30.0 - 36.0 g/dL   RDW 84.112.0 32.411.5 - 40.115.5 %   Platelets 201 150 - 400 K/uL   nRBC 0.0 0.0 - 0.2 %    Comment: Performed at [facility], [address] [place], [place] [address]  Urinalysis, Complete w Microscopic     Status:  Abnormal   Collection Time: 08/06/18  8:15 AM  Result Value Ref Range   Color, Urine YELLOW (A) YELLOW   APPearance CLEAR (A) CLEAR   Specific Gravity, Urine 1.011 1.005 - 1.030   pH 7.0 5.0 - 8.0   Glucose, UA NEGATIVE NEGATIVE mg/dL   Hgb urine dipstick NEGATIVE NEGATIVE   Bilirubin Urine NEGATIVE NEGATIVE   Ketones, ur NEGATIVE NEGATIVE mg/dL   Protein, ur NEGATIVE NEGATIVE mg/dL   Nitrite NEGATIVE NEGATIVE   Leukocytes, UA NEGATIVE NEGATIVE   WBC, UA 0-5 0 - 5 WBC/hpf   Bacteria, UA NONE SEEN NONE SEEN   Squamous Epithelial / LPF 0-5 0 - 5    Comment: Performed at [facility], [address] [place], [place] [address]   Ct Abdomen Pelvis W Contrast  Result Date: 08/06/2018 CLINICAL DATA:  Epigastric abdominal pain starting yesterday. Bloating. Pain has migrated to the right lower quadrant. EXAM: CT ABDOMEN AND PELVIS WITH CONTRAST TECHNIQUE: Multidetector CT imaging of the abdomen and pelvis was performed using the standard protocol following bolus administration of intravenous contrast. CONTRAST:  100mL ISOVUE-300 IOPAMIDOL  (ISOVUE-300) INJECTION 61% COMPARISON:  10/20/2007 FINDINGS: Lower chest: Unremarkable Hepatobiliary: Unremarkable Pancreas: Unremarkable Spleen: Unremarkable Adrenals/Urinary Tract: Duplicated left renal collecting system, with characteristic appearance of left ureterocele from the upper pole moiety on image 48/5. Adrenal glands normal. Otherwise unremarkable. Stomach/Bowel: Acute appendicitis is observed, appendiceal diameter 1.6 cm, extensive periappendiceal stranding. No extraluminal gas or separate abscess is identified. The appendix ascends between the ascending colon and the psoas along the right paracolic gutter. Vascular/Lymphatic: 0.9 cm aortocaval lymph node on image 40/2, upper normal size, formerly the same on 10/20/2007. Reproductive: Uterus absent. Adnexa unremarkable. Other: No supplemental non-categorized findings. Musculoskeletal: Left paracentral disc protrusion at the L1-2 level. IMPRESSION: 1. Acute appendicitis, appendiceal diameter 1.6 cm, with extensive periappendiceal stranding. No extraluminal gas or separate abscess. 2. Duplicated left renal collecting system, with characteristic appearance of left ureterocele from the upper pole moiety. 3. 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 Electronically Signed   By: Gaylyn RongWalter  Liebkemann M.D.   On: 08/06/2018 10:16    Review of Systems  All other systems reviewed and are negative.   Blood pressure (!) 141/99, pulse 90, temperature 98.4 F (36.9 C), temperature source Oral, resp. rate 12, SpO2 100 %. Physical Exam  Constitutional: She is oriented to person, place, and time. She appears well-developed and well-nourished. No distress.  Uncomfortable-appearing.   HENT:  Head: Normocephalic and atraumatic.  Mouth/Throat: No oropharyngeal exudate.  Eyes: Pupils are equal, round, and reactive to light. Right eye exhibits no discharge. Left eye exhibits no discharge. No scleral icterus.  Neck: Normal range of motion. No thyromegaly present.   Cardiovascular: Normal rate and regular rhythm.  Respiratory: Effort normal.  GI: Soft. There is abdominal tenderness. There is rebound and guarding.  Musculoskeletal: Normal range of motion.        General: No deformity or edema.  Lymphadenopathy:    She has no cervical adenopathy.  Neurological: She is alert and oriented to person, place, and time.  Skin: Skin is warm and dry.  Psychiatric: She has a normal mood and affect. Her behavior is normal.     Assessment/Plan  This is a 58 year old woman with a history, physical exam, lab findings, and radiography all consistent with acute appendicitis.  It appears to be nonperforated.  We will plan for laparoscopic appendectomy later today.  I discussed the risks of the operation with the patient, including  bleeding, infection, damage to surrounding tissues, need to convert to open procedure, and the risks of anesthesia.  She agreed to proceed and signed informed consent.  Duanne Guess, MD 08/06/2018, 11:12 AM

## 2018-08-06 NOTE — Progress Notes (Signed)
15 minute call to floor. 

## 2018-08-06 NOTE — Op Note (Signed)
Operative Note  Laparoscopic Appendectomy   Dannielle Burn Horan Date of operation:  08/06/2018  Indications: The patient presented with a history of  abdominal pain. Workup has revealed findings consistent with acute appendicitis.  Pre-operative Diagnosis: Acute appendicitis with generalized peritonitis  Post-operative Diagnosis: Same  Surgeon: Duanne Guess, MD  Anesthesia: GETA  Findings: gangrenous retrocecal appendix without frank pus or perforation seen.  Estimated Blood Loss: 25 cc         Specimens: appendix         Complications:  None immediately apparent.  Procedure Details  The patient was seen again in the preop area. The options of surgery versus observation were reviewed with the patient and/or family. The risks of bleeding, infection, recurrence of symptoms, negative laparoscopy, potential for an open procedure, bowel injury, abscess or infection, were all reviewed as well. The patient was taken to Operating Room, identified as ANIECIA SALDATE and the procedure verified as laparoscopic appendectomy. A time out was performed and the above information confirmed.  The patient was placed in the supine position and general anesthesia was induced.  Antibiotic prophylaxis was administered and VT E prophylaxis was in place. A Foley catheter was placed by the nursing staff.   The abdomen was prepped and draped in a sterile fashion. An infraumbilical incision was made. A cutdown technique was used to try to enter the abdominal cavity. I didn't feel like I was able to enter easily and was concerned for scar tissue from prior surgery, so I used Optiview technique to enter the abdomen in the RUQ. A 5 mm port was inserted. Pneumoperitoneum obtained. I inspected the periumbilical site and saw only fat, so I placed a 11 mm port there under direct vision. Two 5 mm ports working were placed under direct visualization.  The appendix was identified in a retrocecal location.and found to be  acutely inflamed and gangrenous. No pus was appreciated. The appendix was carefully dissected. The mesoappendix was divided withHarmonic scalpel. The base of the appendix was dissected out and divided with a standard load Endo GIA.The appendix was placed in a Endo Catch bag and removed via the umbilical port. The right lower quadrant and pelvis was then irrigated with normal saline which was then aspirated. The right lower quadrant was inspected there was no sign of bleeding or bowel injury therefore pneumoperitoneum was released, all ports were removed.  The umbilical fascia was closed with 0 Vicryl interrupted sutures and the skin incisions were approximated with subcuticular 4-0 Monocryl. Dermabond was applied The patient tolerated the procedure well and there were no immediately apparent complications. The sponge lap and needle count were correct at the end of the procedure.  The patient was taken to the recovery room in stable condition to be admitted for continued care.   Duanne Guess, MD, FACS

## 2018-08-06 NOTE — Brief Op Note (Signed)
08/06/2018  5:27 PM  PATIENT:  Nicole [name]  58 y.o. female  PRE-OPERATIVE DIAGNOSIS:  acute appendicitis  POST-OPERATIVE DIAGNOSIS:  same  PROCEDURE:  Procedure(s): APPENDECTOMY LAPAROSCOPIC (N/A)  SURGEON:  Surgeon(s) and Role:    * Duanne Guess, MD - Primary  PHYSICIAN ASSISTANT:   ASSISTANTS: none   ANESTHESIA:   general  EBL:  25 mL   BLOOD ADMINISTERED:none  DRAINS: none   LOCAL MEDICATIONS USED:  Amount: 20 ml  SPECIMEN:  Source of Specimen:  appendix  DISPOSITION OF SPECIMEN:  PATHOLOGY  COUNTS:  YES  TOURNIQUET:  * No tourniquets in log *  DICTATION: .Note written in EPIC  PLAN OF CARE: Admit for overnight observation  PATIENT DISPOSITION:  PACU - hemodynamically stable.   Delay start of Pharmacological VTE agent (>24hrs) due to surgical blood loss or risk of bleeding: not applicable

## 2018-08-06 NOTE — Transfer of Care (Signed)
Immediate Anesthesia Transfer of Care Note  Patient: Nicole [name]  Procedure(s) Performed: APPENDECTOMY LAPAROSCOPIC (N/A )  Patient Location: PACU  Anesthesia Type:General  Level of Consciousness: sedated  Airway & Oxygen Therapy: Patient connected to face mask oxygen  Post-op Assessment: Post -op Vital signs reviewed and stable  Post vital signs: stable  Last Vitals:  Vitals Value Taken Time  BP 120/79 08/06/2018  5:19 PM  Temp    Pulse 84 08/06/2018  5:19 PM  Resp 11 08/06/2018  5:19 PM  SpO2 100 % 08/06/2018  5:19 PM  Vitals shown include unvalidated device data.  Last Pain:  Vitals:   08/06/18 1335  TempSrc:   PainSc: 8          Complications: No apparent anesthesia complications

## 2018-08-07 ENCOUNTER — Encounter: Payer: Self-pay | Admitting: General Surgery

## 2018-08-07 DIAGNOSIS — K3580 Unspecified acute appendicitis: Secondary | ICD-10-CM | POA: Diagnosis not present

## 2018-08-07 MED ORDER — OXYCODONE HCL 5 MG PO TABS: 5.0000 mg | ORAL_TABLET | Freq: Four times a day (QID) | ORAL | 0 refills | Status: DC | PRN

## 2018-08-07 NOTE — Progress Notes (Signed)
Nsg Discharge Note  Admit Date:  08/06/2018 Discharge date: 08/07/2018   Nicole [name] to be D/C'd Home per MD order.  AVS completed.  Copy for chart, and copy for patient signed, and dated. Patient/caregiver able to verbalize understanding.  Discharge Medication: Allergies as of 08/07/2018      Reactions   Ampicillin Anaphylaxis   Codeine    hallucinations   Hydrocodone-acetaminophen Nausea And Vomiting   Penicillins Anaphylaxis, Hives      Medication List    TAKE these medications   cetirizine 10 MG tablet Commonly known as:  ZYRTEC Take 10 mg by mouth daily at 6 (six) AM.   conjugated estrogens vaginal cream Commonly known as:  PREMARIN Place 0.625 mg vaginally 2 (two) times a week.   cyclobenzaprine 10 MG tablet Commonly known as:  FLEXERIL Take 10 mg by mouth 3 (three) times daily.   DULoxetine 30 MG capsule Commonly known as:  CYMBALTA Take 30 mg by mouth daily at 6 (six) AM.   DULoxetine 60 MG capsule Commonly known as:  CYMBALTA Take 60 mg by mouth daily at 6 (six) AM.   fluticasone 50 MCG/ACT nasal spray Commonly known as:  FLONASE Place 2 sprays into both nostrils daily at 6 (six) AM.   gabapentin 100 MG capsule Commonly known as:  NEURONTIN Take 100 mg by mouth at bedtime as needed.   gabapentin 300 MG capsule Commonly known as:  NEURONTIN Take 300 mg by mouth 3 (three) times daily.   methocarbamol 500 MG tablet Commonly known as:  ROBAXIN Take 1 tablet (500 mg total) by mouth 2 (two) times daily.   naproxen 500 MG tablet Commonly known as:  NAPROSYN Take 500 mg by mouth 2 (two) times daily.   oxyCODONE 5 MG immediate release tablet Commonly known as:  Oxy IR/ROXICODONE Take 1 tablet (5 mg total) by mouth every 6 (six) hours as needed for severe pain or breakthrough pain.       Discharge Assessment: Vitals:   08/06/18 1907 08/07/18 0346  BP: 136/89 133/84  Pulse: 90 83  Resp: 16 18  Temp: 98.2 F (36.8 C) 98.1 F (36.7 C)  SpO2:  95% 100%   Skin clean, dry and intact without evidence of skin break down, no evidence of skin tears noted. IV catheter discontinued intact. Site without signs and symptoms of complications - no redness or edema noted at insertion site, patient denies c/o pain - only slight tenderness at site.  Dressing with slight pressure applied.  D/c Instructions-Education: Discharge instructions given to patient/family with verbalized understanding. D/c education completed with patient/family including follow up instructions, medication list, d/c activities limitations if indicated, with other d/c instructions as indicated by MD - patient able to verbalize understanding, all questions fully answered. Patient instructed to return to ED, call 911, or call MD for any changes in condition.   Pt's family member asking for blanket for d/c ride home. Pt and family educated they cannot take blankets home. Pt's family member asking "when did they stop that?". Educated by this RN that it has always been policy that linens have to stay in the hospital.  Patient escorted via WC, and D/C home via private auto.  Adair Laundry, RN 08/07/2018 11:28 AM

## 2018-08-07 NOTE — Discharge Summary (Addendum)
[facility] SURGICAL DISCHARGE SUMMARY  Patient ID: Nicole [name] MRN: [id number] DOB/AGE: January 01, 1961 58 y.o.  Admit date: 08/06/2018 Discharge date: 08/07/2018  Discharge Diagnoses Acute Appendicitis  Consultants None  Procedures Laparoscopic Appendectomy  HPI: She presented to the emergency department with abdominal pain.  She reports initial onset in the upper and mid abdomen, particularly in the periumbilical area.  This started last night.  This morning, the pain is much more severe and his located itself in the right lower quadrant.  She denies any vomiting but did state that she had some nausea.  She denies fevers or chills.  She has a previous history of a partial hysterectomy.  She states that she last ate something at about 6:00 this morning which consisted of a few blueberries and a sip of Pepsi.  Hospital Course: Informed consent was obtained and documented, and patient underwent uneventful laparoscopic appendectomy (Dr Lady Gary, 08/06/2018).  Post-operatively, patient's pain and nausea improved/resolved and advancement of patient's diet and ambulation were well-tolerated. The remainder of patient's hospital course was essentially unremarkable, and discharge planning was initiated accordingly with patient safely able to be discharged home with appropriate discharge instructions, pain control, and outpatient follow-up after all of her and her family's questions were answered to their expressed satisfaction.  Discharge Condition: Good   Physical Examination:  Constitutional: Well appearing female, NAD Pulmonary: No respiratory distress Gastrointestinal: Soft, incisional tenderness, non distended Skin: laparoscopic inscions are CDI, no erythema or drainage   Allergies as of 08/07/2018      Reactions   Ampicillin Anaphylaxis   Codeine    hallucinations   Hydrocodone-acetaminophen Nausea And Vomiting   Penicillins Anaphylaxis, Hives      Medication List     TAKE these medications   cetirizine 10 MG tablet Commonly known as:  ZYRTEC Take 10 mg by mouth daily at 6 (six) AM.   conjugated estrogens vaginal cream Commonly known as:  PREMARIN Place 0.625 mg vaginally 2 (two) times a week.   cyclobenzaprine 10 MG tablet Commonly known as:  FLEXERIL Take 10 mg by mouth 3 (three) times daily.   DULoxetine 30 MG capsule Commonly known as:  CYMBALTA Take 30 mg by mouth daily at 6 (six) AM.   DULoxetine 60 MG capsule Commonly known as:  CYMBALTA Take 60 mg by mouth daily at 6 (six) AM.   fluticasone 50 MCG/ACT nasal spray Commonly known as:  FLONASE Place 2 sprays into both nostrils daily at 6 (six) AM.   gabapentin 100 MG capsule Commonly known as:  NEURONTIN Take 100 mg by mouth at bedtime as needed.   gabapentin 300 MG capsule Commonly known as:  NEURONTIN Take 300 mg by mouth 3 (three) times daily.   methocarbamol 500 MG tablet Commonly known as:  ROBAXIN Take 1 tablet (500 mg total) by mouth 2 (two) times daily.   naproxen 500 MG tablet Commonly known as:  NAPROSYN Take 500 mg by mouth 2 (two) times daily.   oxyCODONE 5 MG immediate release tablet Commonly known as:  Oxy IR/ROXICODONE Take 1 tablet (5 mg total) by mouth every 6 (six) hours as needed for severe pain or breakthrough pain.        Follow-up Information    Duanne Guess, MD. Schedule an appointment as soon as possible for a visit in 2 week(s).   Specialty:  General Surgery Why:  s/p lap appy Contact information: [address] [place] [address] [phone]            --  Lynden Oxford , PA-C [facility]  08/07/2018, 10:29 AM [phone] M-F: 7am - 4pm  I saw and evaluated the patient.  I agree with the above documentation, exam, and plan, which I have edited where appropriate. Duanne Guess  8:45 AM

## 2018-08-07 NOTE — Discharge Instructions (Signed)
Appendicitis, Adult  The appendix is a tube in the body that is shaped like a finger. It is attached to the large intestine. Appendicitis means that this tube is swollen (inflamed). If this is not treated, the tube can tear (rupture). This can lead to a life-threatening infection. This condition can also cause pus to build up in the appendix (abscess). What are the causes? This condition may be caused by something that blocks the appendix. These include:  A ball of poop (stool).  Lymph glands that are bigger than normal. Sometimes the cause is not known. What increases the risk? You are more likely to develop this condition if you are between 10 and 30 years of age. What are the signs or symptoms? Symptoms of this condition include:  Pain around the belly button (navel). ? The pain moves toward the lower right belly (abdomen). ? The pain can get worse with time. ? The pain can get worse if you cough. ? The pain can get worse if you move suddenly.  Tenderness in the lower right belly.  Feeling sick to your stomach (nauseous).  Throwing up (vomiting).  Not feeling hungry (loss of appetite).  A fever.  Having trouble pooping (constipation).  Watery poop (diarrhea).  Not feeling well. How is this treated? Most often, this condition is treated by taking out the appendix (appendectomy). There are two ways to do this:  Open surgery. For this method, the appendix is taken out through a large cut (incision). The cut is made in the lower right belly. This surgery may be used if: ? You have scars from another surgery. ? You have a bleeding condition. ? You are pregnant and will be having your baby soon. ? You have a condition that makes it hard to do the other type of surgery.  Laparoscopic surgery. For this method, the appendix is taken out through small cuts. Often, this surgery: ? Causes less pain. ? Causes fewer problems. ? Is easier to heal from. If your appendix tears and  pus forms:  A drain may be put into the sore. The drain will be used to get rid of the pus.  You may get an antibiotic medicine through an IV line.  Your appendix may or may not need to be taken out. Follow these instructions at home: If you had surgery, follow instructions from your doctor on how to care for yourself at home and how to take care of your cut from surgery. Medicines  Take over-the-counter and prescription medicines only as told by your doctor.  If you were prescribed an antibiotic medicine, take it as told by your doctor. Do not stop taking the antibiotic even if you start to feel better. Eating and drinking Follow instructions from your doctor about what you cannot eat or drink. You may go back to your diet slowly if:  You no longer feel sick to your stomach.  You have stopped throwing up. General instructions  Do not use any products that contain nicotine or tobacco, such as cigarettes, e-cigarettes, and chewing tobacco. If you need help quitting, ask your doctor.  Do not drive or use heavy machinery while taking prescription pain medicine.  Ask your doctor if the medicine you are taking can cause trouble pooping. You may need to take steps to prevent or treat trouble pooping: ? Drink enough fluid to keep your pee (urine) pale yellow. ? Take over-the-counter or prescription medicines. ? Eat foods that are high in fiber. These include beans,   whole grains, and fresh fruits and vegetables. ? Limit foods that are high in fat and sugar. These include fried or sweet foods.  Keep all follow-up visits as told by your doctor. This is important. Contact a doctor if:  There is pus, blood, or a lot of fluid coming from your cut or cuts from surgery.  You are sick to your stomach or you throw up. Get help right away if:  You have pain in your belly, and the pain is getting worse.  You have a fever.  You have chills.  You are very tired.  You have muscle  pain.  You are short of breath. Summary  Appendicitis is swelling of the appendix. The appendix is a tube that is shaped like a finger. It is joined to the large intestine.  This condition may be caused by something that blocks the appendix. This can lead to an infection.  This condition is usually treated by taking out the appendix. This information is not intended to replace advice given to you by your health care provider. Make sure you discuss any questions you have with your health care provider. Document Released: 10/11/2011 Document Revised: 01/04/2018 Document Reviewed: 01/04/2018 Elsevier Interactive Patient Education  2019 [employer]   Laparoscopic Appendectomy, Adult, Care After This sheet gives you information about how to care for yourself after your procedure. Your doctor may also give you more specific instructions. If you have problems or questions, contact your doctor. What can I expect after the procedure? After the procedure, it is common to have:  Little energy for normal activities.  Mild pain in the area where the cuts from surgery (incisions) were made.  Trouble pooping (constipation). This can be caused by: ? Pain medicine. ? A lack of activity. Follow these instructions at home: Medicines  Take over-the-counter and prescription medicines only as told by your doctor.  If you were prescribed an antibiotic medicine, take it as told by your doctor. Do not stop taking it even if you start to feel better.  Do not drive or use heavy machinery while taking prescription pain medicine.  Ask your doctor if the medicine you are taking can cause trouble pooping. You may need to take steps to prevent or treat trouble pooping: ? Drink enough fluid to keep your pee (urine) pale yellow. ? Take over-the-counter or prescription medicines. ? Eat foods that are high in fiber. These include beans, whole grains, and fresh fruits and vegetables. ? Limit foods that are  high in fat and sugar. These include fried or sweet foods. Incision care   Follow instructions from your doctor about how to take care of your cuts from surgery. Make sure you: ? Wash your hands with soap and water before and after you change your bandage (dressing). If you cannot use soap and water, use hand sanitizer. ? Change your bandage as told by your doctor. ? Leave stitches (sutures), skin glue, or skin tape (adhesive) strips in place. They may need to stay in place for 2 weeks or longer. If tape strips get loose and curl up, you may trim the loose edges. Do not remove tape strips completely unless your doctor says it is okay.  Check your cuts from surgery every day for signs of infection. Check for: ? Redness, swelling, or pain. ? Fluid or blood. ? Warmth. ? Pus or a bad smell. Bathing  Keep your cuts from surgery clean and dry. Clean them as told by your doctor. To do  this: 1. Gently wash the cuts with soap and water. 2. Rinse the cuts with water to remove all soap. 3. Pat the cuts dry with a clean towel. Do not rub the cuts.  Do not take baths, swim, or use a hot tub for 2 weeks, or until your doctor says it is okay. You may take showers after 48 hours. Activity   Do not drive for 24 hours if you were given a medicine to help you relax (sedative) during your procedure.  Rest after the procedure. Return to your normal activities as told by your doctor. Ask your doctor what activities are safe for you.  For 3 weeks, or for as long as told by your doctor: ? Do not lift anything that is heavier than 10 lb (4.5 kg), or the limit that you are told. ? Do not play contact sports. General instructions  If you were sent home with a drain, follow instructions from your doctor on how to care for it.  Take deep breaths. This helps to keep your lungs from getting an infection (pneumonia).  Keep all follow-up visits as told by your doctor. This is important. Contact a doctor  if:  You have redness, swelling, or pain around a cut from surgery.  You have fluid or blood coming from a cut.  Your cut feels warm to the touch.  You have pus or a bad smell coming from a cut or a bandage.  The edges of a cut break open after the stitches have been taken out.  You have pain in your shoulders that gets worse.  You feel dizzy or you pass out (faint).  You have shortness of breath.  You keep feeling sick to your stomach (nauseous).  You keep throwing up (vomiting).  You get watery poop (diarrhea) or you cannot control your poop.  You lose your appetite.  You have swelling or pain in your legs.  You get a rash. Get help right away if:  You have a fever.  You have trouble breathing.  You have sharp pains in your chest. Summary  After the procedure, it is common to have low energy, mild pain, and trouble pooping.  Infection is a common problem after this procedure. Follow your doctor's instructions about caring for yourself after the procedure.  Rest after the procedure. Return to your normal activities as told by your doctor.  Contact your doctor if you see signs of infection around your cuts from surgery, or you get short of breath. Get help right away if you have a fever, chest pain, or trouble breathing. This information is not intended to replace advice given to you by your health care provider. Make sure you discuss any questions you have with your health care provider. Document Released: 05/15/2009 Document Revised: 01/19/2018 Document Reviewed: 01/19/2018 Elsevier Interactive Patient Education  2019 [employer].

## 2018-08-07 NOTE — Progress Notes (Signed)
Pt tolerating clear liquid tray. Provided PO pain medication and educated on advancing diet slowly as tolerated.

## 2018-08-08 LAB — HIV ANTIBODY (ROUTINE TESTING W REFLEX): HIV Screen 4th Generation wRfx: NONREACTIVE

## 2018-08-09 ENCOUNTER — Telehealth: Payer: Self-pay | Admitting: Surgery

## 2018-08-09 LAB — SURGICAL PATHOLOGY

## 2018-08-09 NOTE — Telephone Encounter (Signed)
Pt called in with n/v/cold sweats and bloating that is worsening since surgery.  No documented fever.  I recommended ED workup for possible post op ileus, but patient stated she feels little better now after emesis and some sleep and declined recommendation.  She states she will reconsider if symptoms persist.  I did reiterate multiple times that this is not typical for after surgery, and she verbalized understanding and still stated she will continue to monitor at home

## 2018-08-21 ENCOUNTER — Ambulatory Visit
Admission: RE | Admit: 2018-08-21 | Discharge: 2018-08-21 | Disposition: A | Discharge status: Home / Self Care | Payer: BLUE CROSS/BLUE SHIELD | Attending: General Surgery | Admitting: General Surgery

## 2018-08-21 ENCOUNTER — Other Ambulatory Visit: Payer: Self-pay

## 2018-08-21 ENCOUNTER — Ambulatory Visit
Admission: RE | Admit: 2018-08-21 | Discharge: 2018-08-21 | Disposition: A | Discharge status: Home / Self Care | Payer: BLUE CROSS/BLUE SHIELD | Source: Ambulatory Visit | Attending: General Surgery | Admitting: General Surgery

## 2018-08-21 ENCOUNTER — Ambulatory Visit (INDEPENDENT_AMBULATORY_CARE_PROVIDER_SITE_OTHER): Payer: BLUE CROSS/BLUE SHIELD | Admitting: General Surgery

## 2018-08-21 ENCOUNTER — Encounter: Payer: Self-pay | Admitting: General Surgery

## 2018-08-21 ENCOUNTER — Telehealth: Payer: Self-pay | Admitting: General Surgery

## 2018-08-21 VITALS — BP 121/88 | HR 109 | Temp 97.3°F | Ht 68.0 in | Wt 184.0 lb

## 2018-08-21 DIAGNOSIS — K3589 Other acute appendicitis without perforation or gangrene: Secondary | ICD-10-CM

## 2018-08-21 DIAGNOSIS — R109 Unspecified abdominal pain: Secondary | ICD-10-CM | POA: Insufficient documentation

## 2018-08-21 NOTE — Progress Notes (Signed)
Nicole [name] is here today after undergoing a laparoscopic appendectomy on 06 August 2018.  Her appendix was gangrenous but had not yet perforated.  The operation itself was uncomplicated.  On postop day 3, she called the answering service and spoke with Dr. Tonna BoehringerSakai, who was covering.  His note reports that she had nausea, vomiting, and cold sweats.  She also had worsening bloating since her operation but denied having a fever.  He encouraged her to come to the emergency room for evaluation since this was not completely normal after this operation.  She declined this offer.  She apparently spoke to somebody else in the emergency department and they advised her to stop taking the oxycodone.  She took some tramadol and naproxen sodium that she had leftover from a prior ailment.  She says that she did better once she stopped the oxycodone.  She still feels some bloating.  Her appetite is somewhat poor but she is having bowel movements.  She is afebrile today in clinic.  Pathology: [name]. VERMIFORM APPENDIX; APPENDECTOMY:  -ACUTE SUPPURATIVE APPENDICITIS  Vitals:   08/21/18 1037  BP: 121/88  Pulse: (!) 109  Temp: (!) 97.3 F (36.3 C)  SpO2: 98%   Focused abdominal exam: The abdomen is soft and nondistended.  There is firmness in the supraumbilical area where I had to navigate scar from her prior operation.  The laparoscopic port sites are all healing nicely without evidence of infection.  She does have some tenderness to palpation, not unexpected, around the umbilical site.  Impression and plan: Nicole [name] is a 58 year old woman who underwent a laparoscopic appendectomy on January 5.  Her postoperative course has been a little bit rocky with nausea vomiting and bloating.  This is improving, however she still is not as far along as I would expect.  It is possible, that the gangrenous appendix and surrounding inflammation resulted in a more noticeable postoperative ileus than is common.  I will send her  for a an abdominal x-ray today to further evaluate.  I have encouraged her to continue to increase her activity as she tolerates.  She should take over-the-counter ibuprofen, Tylenol, or Aleve for her pain.  I will contact her with the results of the abdominal films.  No additional scheduled follow up at this time.

## 2018-08-21 NOTE — Patient Instructions (Signed)
Patient may take tylenol, advil, or aleve as needed for pain or discomfort.

## 2018-08-21 NOTE — Telephone Encounter (Signed)
Discussed AXR results w/pt.  Aside from fecal loading/constipation, no acute findings. Patient advised to take OTC agent, such as colace or miralax for constipation.

## 2018-08-22 ENCOUNTER — Other Ambulatory Visit: Payer: Self-pay | Admitting: General Surgery

## 2018-08-27 ENCOUNTER — Telehealth: Payer: Self-pay | Admitting: General Surgery

## 2018-08-27 NOTE — Telephone Encounter (Signed)
Patient called because she continue to feel bloated and with abdomen distended. She report that she has no pain, no nausea, no vomiting and no fever.   She was asking if she can go back to work as recommended by Dr. Lady Gary on last office visit on 08/21/18. As per chart review and patient's history, the patient had a really bad gangrenous appendicitis and is recovering slower than expected.   She refers that she will wait until the morning to decide to go back to work or not. If she does not feel better, she will call Dr. America Brown office for re evaluation. I she feels better she will go back to work.  Patient understand that if she develop nausea, vomiting, abdominal pain or fever, she will need to go to ED for evaluation.   Carolan Shiver, MD

## 2018-08-28 ENCOUNTER — Telehealth: Payer: Self-pay | Admitting: General Surgery

## 2018-08-28 NOTE — Progress Notes (Signed)
Patient called into the office requesting another week off of work stating that she is feeling discomfort in her stomach, bloating and warm to touch states she is unable to sit for long periods. Per Dr.Cannon she has been approved for another week off. Her fiance will pick up the letter from the front desk.

## 2018-08-28 NOTE — Telephone Encounter (Signed)
Patient is calling said she spoke with the doctor on call yesterday and told them she still wasn't feeling well and would like to extend her time before going back to work and was told to call the office if she needed more time. Please call patient and advise.

## 2019-04-19 ENCOUNTER — Other Ambulatory Visit: Payer: Self-pay

## 2019-04-19 DIAGNOSIS — Z20822 Contact with and (suspected) exposure to covid-19: Secondary | ICD-10-CM

## 2019-04-21 LAB — NOVEL CORONAVIRUS, NAA: SARS-CoV-2, NAA: NOT DETECTED

## 2019-06-16 IMAGING — CT CT ABD-PELV W/ CM
2 of 5 series · 16 of 46 positions shown, 18 images · IV contrast (APPLIED)
Comparison: 10/20/2007

CLINICAL DATA: Epigastric abdominal pain starting yesterday.
Bloating. Pain has migrated to the right lower quadrant.

EXAM:
CT ABDOMEN AND PELVIS WITH CONTRAST
TECHNIQUE: Multidetector CT imaging of the abdomen and pelvis was performed
using the standard protocol following bolus administration of
intravenous contrast.
CONTRAST:  100mL ZDF9SK-HAA IOPAMIDOL (ZDF9SK-HAA) INJECTION 61%

[Series 2: routine abd/pel with · axial · 0.92mm/px · z∈[-170,+265]mm · 13 of 99 slices shown, 15 images]
[im 6/99  soft-tissue]
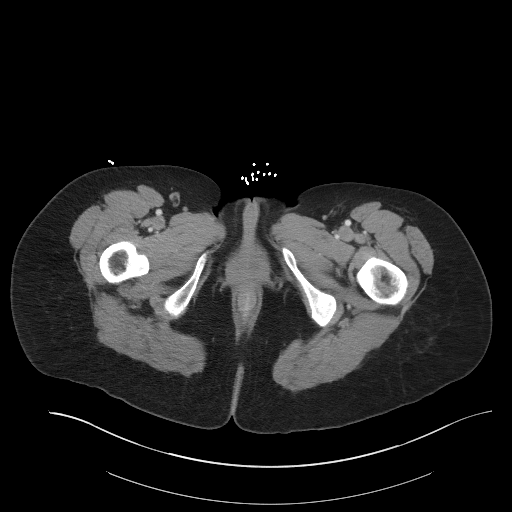
[im 6/99  bone]
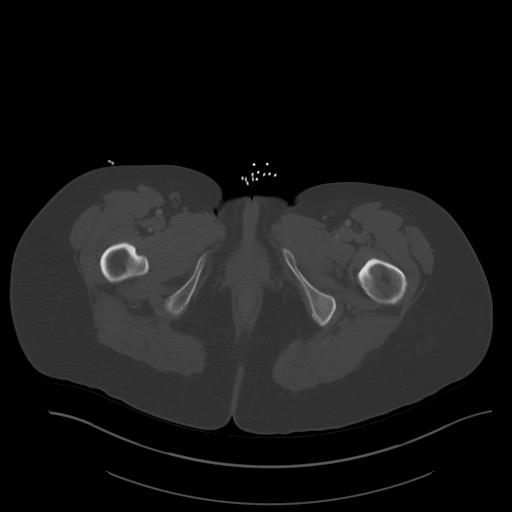
[im 12/99  soft-tissue]
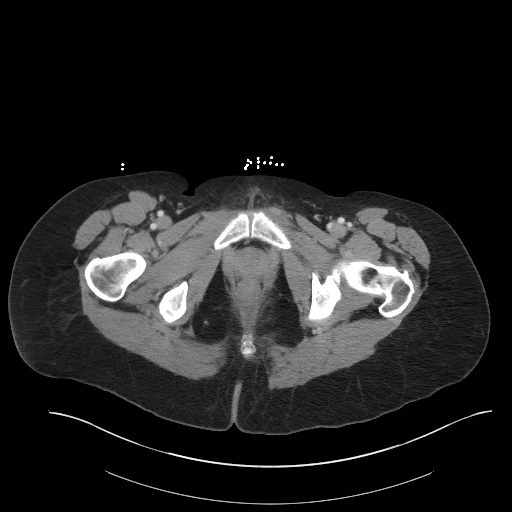
[im 24/99  soft-tissue]
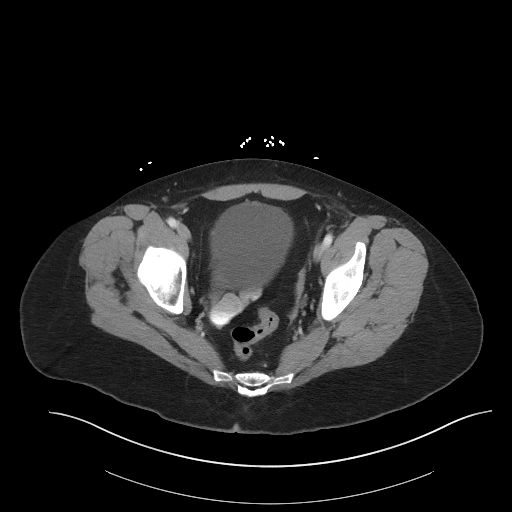
[im 29/99  soft-tissue]
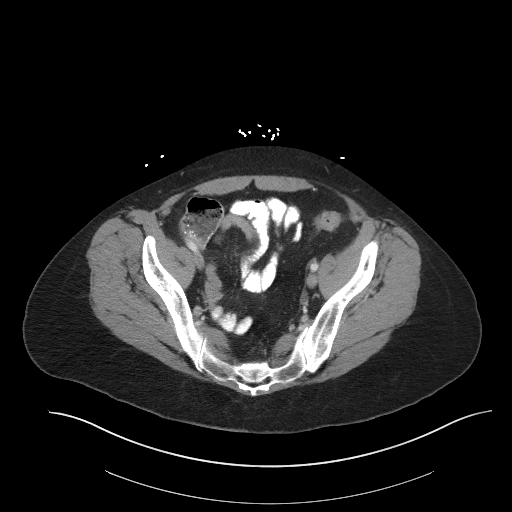
[im 35/99  soft-tissue]
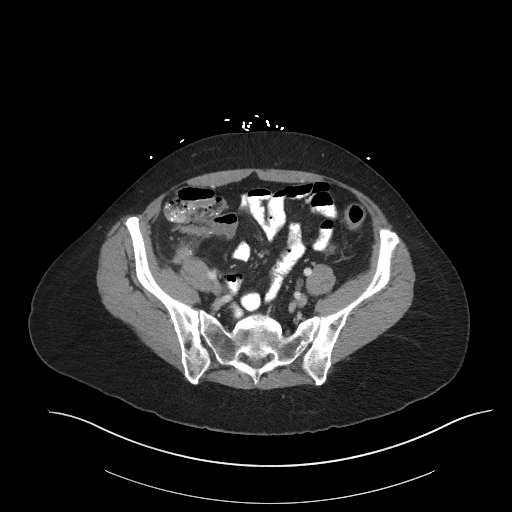
[im 41/99  soft-tissue]
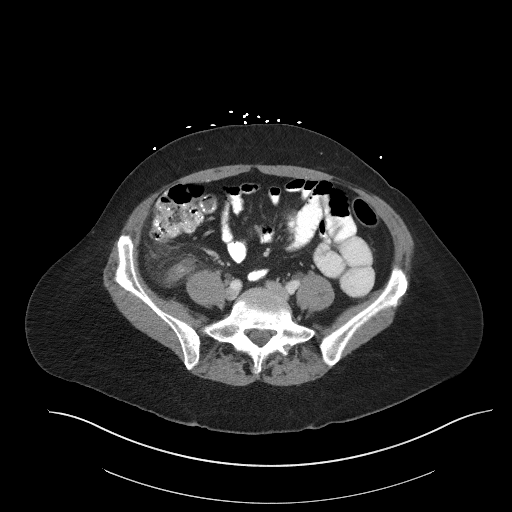
[im 52/99  soft-tissue]
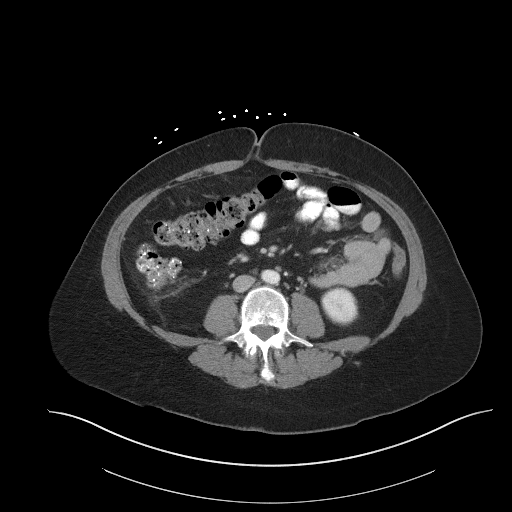
[im 58/99  soft-tissue]
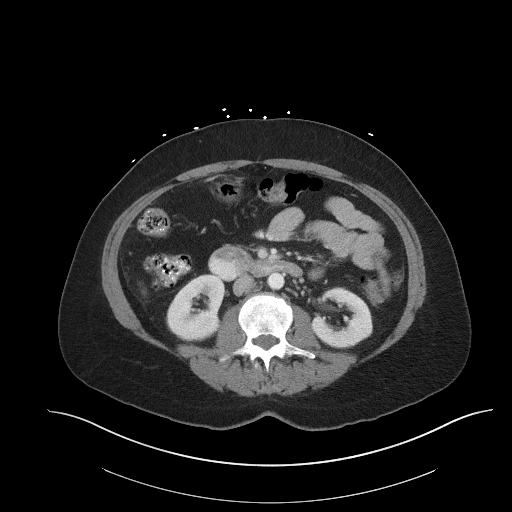
[im 64/99  soft-tissue]
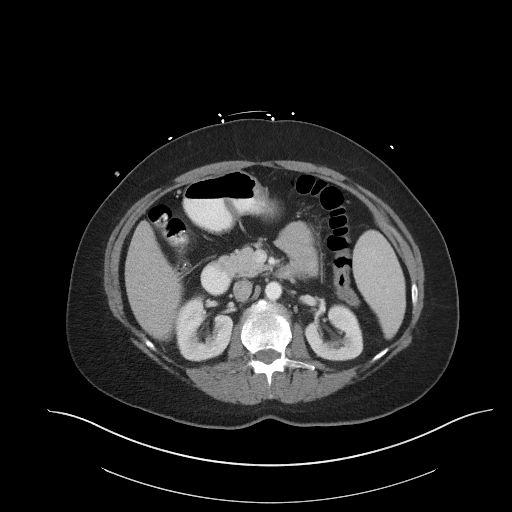
[im 64/99  bone]
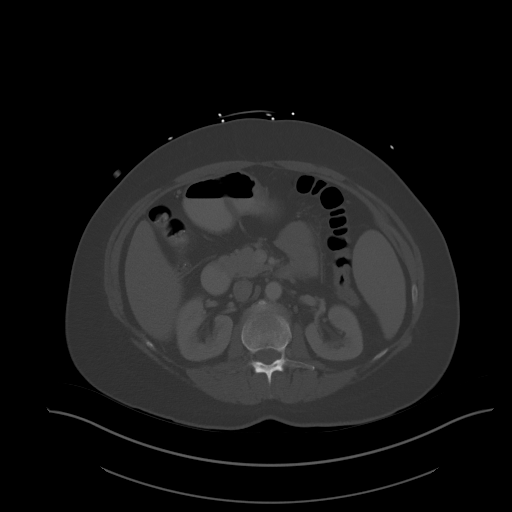
[im 70/99  soft-tissue]
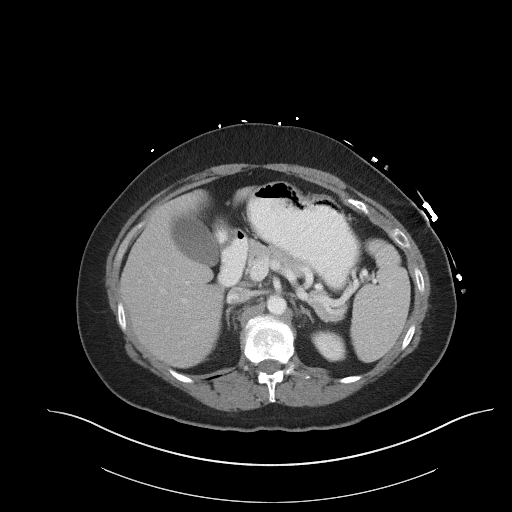
[im 75/99  soft-tissue]
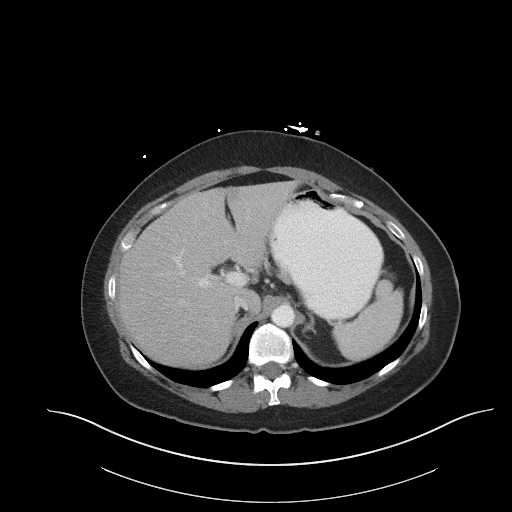
[im 87/99  soft-tissue]
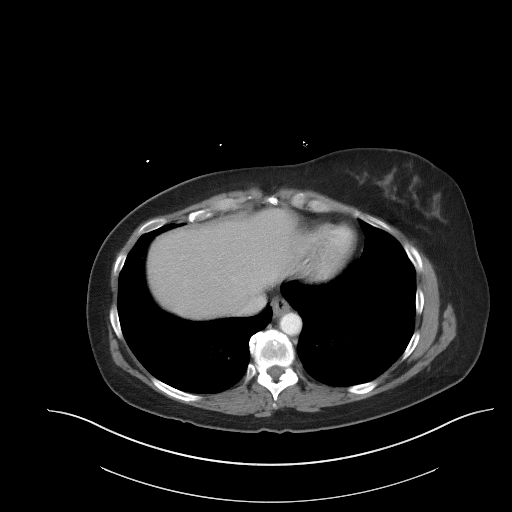
[im 93/99  soft-tissue]
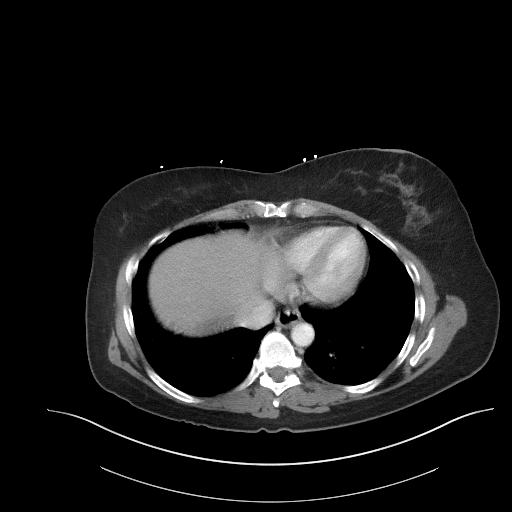

[Series 5: coronal st · coronal · 0.72mm/px · 3 of 92 slices shown]
[im 31/92  soft-tissue]
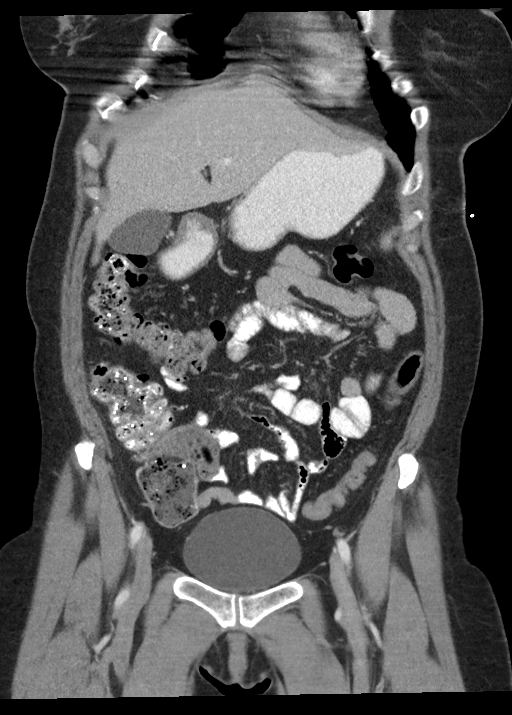
[im 41/92  soft-tissue]
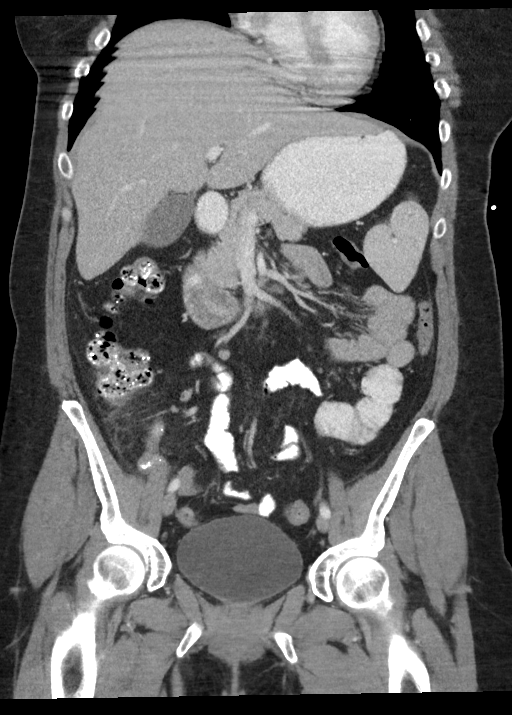
[im 51/92  soft-tissue]
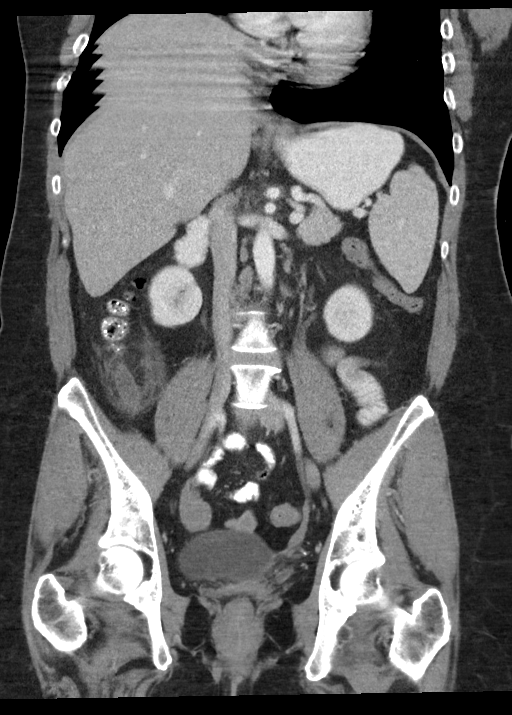

[16 of 46 positions shown; findings below may reference images not displayed]

FINDINGS: Lower chest: Unremarkable

Hepatobiliary: Unremarkable

Pancreas: Unremarkable

Spleen: Unremarkable

Adrenals/Urinary Tract: Duplicated left renal collecting system,
with characteristic appearance of left ureterocele from the upper
pole moiety on image 48/5. Adrenal glands normal. Otherwise
unremarkable.

Stomach/Bowel: Acute appendicitis is observed, appendiceal diameter
1.6 cm, extensive periappendiceal stranding. No extraluminal gas or
separate abscess is identified. The appendix ascends between the
ascending colon and the psoas along the right paracolic gutter.

Vascular/Lymphatic: 0.9 cm aortocaval lymph node on image 40/2,
upper normal size, formerly the same on 10/20/2007.

Reproductive: Uterus absent. Adnexa unremarkable.

Other: No supplemental non-categorized findings.

Musculoskeletal: Left paracentral disc protrusion at the L1-2 level.
IMPRESSION: 1. Acute appendicitis, appendiceal diameter 1.6 cm, with extensive
periappendiceal stranding. No extraluminal gas or separate abscess.
2. Duplicated left renal collecting system, with characteristic
appearance of left ureterocele from the upper pole moiety.
3. Left paracentral disc protrusion at L1-2.

## 2020-04-01 ENCOUNTER — Encounter: Payer: Self-pay | Admitting: Adult Health

## 2020-04-01 ENCOUNTER — Other Ambulatory Visit: Payer: Self-pay

## 2020-04-01 ENCOUNTER — Ambulatory Visit (INDEPENDENT_AMBULATORY_CARE_PROVIDER_SITE_OTHER): Payer: BC Managed Care – PPO | Admitting: Adult Health

## 2020-04-01 VITALS — BP 166/101 | HR 99 | Ht 68.5 in | Wt 185.0 lb

## 2020-04-01 DIAGNOSIS — F331 Major depressive disorder, recurrent, moderate: Secondary | ICD-10-CM | POA: Diagnosis not present

## 2020-04-01 DIAGNOSIS — F411 Generalized anxiety disorder: Secondary | ICD-10-CM

## 2020-04-01 DIAGNOSIS — G4700 Insomnia, unspecified: Secondary | ICD-10-CM

## 2020-04-01 MED ORDER — DULOXETINE HCL 60 MG PO CPEP: 60.0000 mg | ORAL_CAPSULE | Freq: Two times a day (BID) | ORAL | 2 refills | Status: DC

## 2020-04-01 MED ORDER — ZOLPIDEM TARTRATE 10 MG PO TABS: 10.0000 mg | ORAL_TABLET | Freq: Every evening | ORAL | 0 refills | Status: DC | PRN

## 2020-04-01 NOTE — Progress Notes (Signed)
[facility] MD/PA/NP Initial Note  04/01/2020 12:26 PM Nicole [name]  MRN:  [id number]  Chief Complaint:   HPI:   Describes mood today as "not good". Pleasant. Mood symptoms - reports depression, anxiety, and irritability. Forgetful. Starting and stopping tasks. Feels overwhelmed most of the time. Increased stress at home and work. Stating "I'm all over the place". Having headaches and neck pain. Has a hard time shutting things off in her head. Concerned about returning to the office. Boyfriend with a brain injury - can go from "zero to a thousand". Was in the hospital last week - "blacked out". Does not feel like Cymbalta is working as well as it did. Decreased interest and motivation. Taking medications as prescribed.  Energy levels lower. Active, does not have a regular exercise routine.  Enjoys some usual interests and activities. Spending time with family. Appetite adequate.  Weight gain - 185 pounds - stress eating. Sleeping difficulties - "can't get to sleep". Averages 5 hours. Stays in the bed and sleeps on the weekends. Focus and concentration stable. Completing tasks. Managing aspects of household. Works full-time at [employer] [place].  Denies SI or HI.  Denies AH or VH.  Previous medication trials: Denies.   Visit Diagnosis:    ICD-10-CM   1. Insomnia, unspecified type  G47.00 zolpidem (AMBIEN) 10 MG tablet  2. Major depressive disorder, recurrent episode, moderate (HCC)  F33.1 DULoxetine (CYMBALTA) 60 MG capsule  3. Generalized anxiety disorder  F41.1 DULoxetine (CYMBALTA) 60 MG capsule    Past Psychiatric History: Denies psychiatric hospitalization.  Past Medical History:  Past Medical History:  Diagnosis Date  . Chronic low back pain     Past Surgical History:  Procedure Laterality Date  . ABDOMINAL HYSTERECTOMY    . LAPAROSCOPIC APPENDECTOMY N/A 08/06/2018   Procedure: APPENDECTOMY LAPAROSCOPIC;  Surgeon: Duanne Guess, MD;  Location: [facility] ORS;  Service: General;   Laterality: N/A;    Family Psychiatric History: Denies any family history of mental illness.  Family History: No family history on file.  Social History:  Social History   Socioeconomic History  . Marital status: Single    Spouse name: Not on file  . Number of children: Not on file  . Years of education: Not on file  . Highest education level: Not on file  Occupational History  . Not on file  Tobacco Use  . Smoking status: Never Smoker  . Smokeless tobacco: Never Used  Substance and Sexual Activity  . Alcohol use: No    Alcohol/week: 0.0 standard drinks  . Drug use: Not on file  . Sexual activity: Not on file  Other Topics Concern  . Not on file  Social History Narrative  . Not on file   Social Determinants of Health   Financial Resource Strain:   . Difficulty of Paying Living Expenses: Not on file  Food Insecurity:   . Worried About Programme researcher, broadcasting/film/video in the Last Year: Not on file  . Ran Out of Food in the Last Year: Not on file  Transportation Needs:   . Lack of Transportation (Medical): Not on file  . Lack of Transportation (Non-Medical): Not on file  Physical Activity:   . Days of Exercise per Week: Not on file  . Minutes of Exercise per Session: Not on file  Stress:   . Feeling of Stress : Not on file  Social Connections:   . Frequency of Communication with Friends and Family: Not on file  . Frequency of Social  Gatherings with Friends and Family: Not on file  . Attends Religious Services: Not on file  . Active Member of Clubs or Organizations: Not on file  . Attends Banker Meetings: Not on file  . Marital Status: Not on file    Allergies:  Allergies  Allergen Reactions  . Ampicillin Anaphylaxis  . Codeine     hallucinations  . Hydrocodone-Acetaminophen Nausea And Vomiting  . Penicillins Anaphylaxis and Hives    Metabolic Disorder Labs: No results found for: HGBA1C, MPG No results found for: PROLACTIN No results found for: CHOL,  TRIG, HDL, CHOLHDL, VLDL, LDLCALC No results found for: TSH  Therapeutic Level Labs: No results found for: LITHIUM No results found for: VALPROATE No components found for:  CBMZ  Current Medications: Current Outpatient Medications  Medication Sig Dispense Refill  . cetirizine (ZYRTEC) 10 MG tablet Take 10 mg by mouth daily at 6 (six) AM.    . conjugated estrogens (PREMARIN) vaginal cream Place 0.625 mg vaginally 2 (two) times a week.    . cyclobenzaprine (FLEXERIL) 10 MG tablet Take 10 mg by mouth 3 (three) times daily.    . DULoxetine (CYMBALTA) 30 MG capsule Take 30 mg by mouth daily at 6 (six) AM.    . DULoxetine (CYMBALTA) 60 MG capsule Take 1 capsule (60 mg total) by mouth 2 (two) times daily. 60 capsule 2  . fluticasone (FLONASE) 50 MCG/ACT nasal spray Place 2 sprays into both nostrils daily at 6 (six) AM.    . gabapentin (NEURONTIN) 100 MG capsule Take 100 mg by mouth at bedtime as needed.    . gabapentin (NEURONTIN) 300 MG capsule Take 300 mg by mouth 3 (three) times daily.    . methocarbamol (ROBAXIN) 500 MG tablet Take 1 tablet (500 mg total) by mouth 2 (two) times daily. 20 tablet 0  . naproxen (NAPROSYN) 500 MG tablet Take 500 mg by mouth 2 (two) times daily.    . traMADol (ULTRAM) 50 MG tablet Take 50 mg by mouth every 6 (six) hours as needed.    . zolpidem (AMBIEN) 10 MG tablet Take 1 tablet (10 mg total) by mouth at bedtime as needed for sleep. 30 tablet 0   No current facility-administered medications for this visit.    Medication Side Effects: none  Orders placed this visit:  No orders of the defined types were placed in this encounter.   Psychiatric Specialty Exam:  Review of Systems  Blood pressure (!) 166/101, pulse 99, height 5' 8.5" (1.74 m), weight 185 lb (83.9 kg).Body mass index is 27.72 kg/m.  General Appearance: Casual, Neat and Well Groomed  Eye Contact:  Good  Speech:  Clear and Coherent and Normal Rate  Volume:  Normal  Mood:  Anxious, Depressed  and Irritable  Affect:  Appropriate and Congruent  Thought Process:  Coherent and Descriptions of Associations: Intact  Orientation:  Full (Time, Place, and Person)  Thought Content: Logical   Suicidal Thoughts:  No  Homicidal Thoughts:  No  Memory:  WNL  Judgement:  Good  Insight:  Good  Psychomotor Activity:  Normal  Concentration:  Concentration: Good  Recall:  Good  Fund of Knowledge: Good  Language: Good  Assets:  Communication Skills Desire for Improvement Financial Resources/Insurance Housing Intimacy Leisure Time Physical Health Resilience Social Support Talents/Skills Transportation Vocational/Educational  ADL's:  Intact  Cognition: WNL  Prognosis:  Good   Screenings: None  Receiving Psychotherapy: No   Treatment Plan/Recommendations:   Plan:  PDMP reviewed  1. Increase Cymbalta 90 to 120mg  daily - 60mg  BID. 2. Add Zolpidem 10mg  at hs - sleep  RTC 4 weeks  Patient advised to contact office with any questions, adverse effects, or acute worsening in signs and symptoms.   , NP

## 2020-05-01 ENCOUNTER — Ambulatory Visit: Payer: BC Managed Care – PPO | Admitting: Adult Health

## 2020-07-10 ENCOUNTER — Ambulatory Visit: Payer: BC Managed Care – PPO | Admitting: Adult Health

## 2020-07-22 ENCOUNTER — Other Ambulatory Visit: Payer: Self-pay

## 2020-07-22 ENCOUNTER — Ambulatory Visit (INDEPENDENT_AMBULATORY_CARE_PROVIDER_SITE_OTHER): Payer: BC Managed Care – PPO | Admitting: Adult Health

## 2020-07-22 ENCOUNTER — Encounter: Payer: Self-pay | Admitting: Adult Health

## 2020-07-22 ENCOUNTER — Telehealth: Payer: Self-pay | Admitting: Adult Health

## 2020-07-22 DIAGNOSIS — G4700 Insomnia, unspecified: Secondary | ICD-10-CM | POA: Diagnosis not present

## 2020-07-22 DIAGNOSIS — F411 Generalized anxiety disorder: Secondary | ICD-10-CM

## 2020-07-22 DIAGNOSIS — F331 Major depressive disorder, recurrent, moderate: Secondary | ICD-10-CM | POA: Diagnosis not present

## 2020-07-22 MED ORDER — ZOLPIDEM TARTRATE 10 MG PO TABS: 10.0000 mg | ORAL_TABLET | Freq: Every evening | ORAL | 0 refills | Status: DC | PRN

## 2020-07-22 MED ORDER — DULOXETINE HCL 60 MG PO CPEP: 60.0000 mg | ORAL_CAPSULE | Freq: Two times a day (BID) | ORAL | 2 refills | Status: DC

## 2020-07-22 NOTE — Telephone Encounter (Signed)
Nicole [name] called and said she just saw you and she called her employer and would like for you to call her regarding the call at [phone].

## 2020-07-22 NOTE — Telephone Encounter (Signed)
Noted  

## 2020-07-22 NOTE — Progress Notes (Signed)
Nicole [name] [id number] 09-26-1960 59 y.o.  Subjective:   Patient ID:  Nicole [name] is a 59 y.o. (DOB 09-Jul-1961) female.  Chief Complaint: No chief complaint on file.   HPI Nicole [name] presents to the office today for follow-up of MDD, GAD, and insomnia.  Describes mood today as "not good". Pleasant. Tearful throughout interview. Mood symptoms - reports depression, anxiety, and irritability. Stating "I'm totally stressed out". Struggling in work setting. Started a new job in November and is having a difficulties. Is not sleeping well - "staying up most of the night". Is out of Ambien. Stating "I can't handle the stress". Feels overwhelmed. Has seen PCP and was started on BP medications. Her PCP has filled out her FMLA paperwork for chronic pain and anxiety. Does not feel capable of returning to work at this time.  Decreased interest and motivation. Taking medications as prescribed.  Energy levels low. Active, does not have a regular exercise routine.  Enjoys some usual interests and activities. Lives alone. Recent break up. Spending time with family - [place]. Appetite adequate. [name] loss - 17 pounds - 181 pounds. Sleeping difficulties. Averages 4 to 5 hours.  Focus and concentration difficulties. Completing tasks. Managing aspects of household. Works full-time at [employer] [place].  Denies SI or HI.  Denies AH or VH.  Previous medication trials: Denies.   Review of Systems:  Review of Systems  Musculoskeletal: Negative for gait problem.  Neurological: Negative for tremors.  Psychiatric/Behavioral:       Please refer to HPI    Medications: I have reviewed the patient's current medications.  Current Outpatient Medications  Medication Sig Dispense Refill   cetirizine (ZYRTEC) 10 MG tablet Take 10 mg by mouth daily at 6 (six) AM.     conjugated estrogens (PREMARIN) vaginal cream Place 0.625 mg vaginally 2 (two) times a week.     cyclobenzaprine (FLEXERIL) 10 MG  tablet Take 10 mg by mouth 3 (three) times daily.     DULoxetine (CYMBALTA) 60 MG capsule Take 1 capsule (60 mg total) by mouth 2 (two) times daily. 60 capsule 2   fluticasone (FLONASE) 50 MCG/ACT nasal spray Place 2 sprays into both nostrils daily at 6 (six) AM.     gabapentin (NEURONTIN) 100 MG capsule Take 100 mg by mouth at bedtime as needed.     gabapentin (NEURONTIN) 300 MG capsule Take 300 mg by mouth 3 (three) times daily.     methocarbamol (ROBAXIN) 500 MG tablet Take 1 tablet (500 mg total) by mouth 2 (two) times daily. 20 tablet 0   naproxen (NAPROSYN) 500 MG tablet Take 500 mg by mouth 2 (two) times daily.     traMADol (ULTRAM) 50 MG tablet Take 50 mg by mouth every 6 (six) hours as needed.     zolpidem (AMBIEN) 10 MG tablet Take 1 tablet (10 mg total) by mouth at bedtime as needed for sleep. 30 tablet 0   No current facility-administered medications for this visit.    Medication Side Effects: None  Allergies:  Allergies  Allergen Reactions   Ampicillin Anaphylaxis   Codeine     hallucinations   Hydrocodone-Acetaminophen Nausea And Vomiting   Penicillins Anaphylaxis and Hives    Past Medical History:  Diagnosis Date   Chronic low back pain     No family history on file.  Social History   Socioeconomic History   Marital status: Single    Spouse name: Not on file   Number of children:  Not on file   Years of education: Not on file   Highest education level: Not on file  Occupational History   Not on file  Tobacco Use   Smoking status: Never Smoker   Smokeless tobacco: Never Used  Substance and Sexual Activity   Alcohol use: No    Alcohol/week: 0.0 standard drinks   Drug use: Not on file   Sexual activity: Not on file  Other Topics Concern   Not on file  Social History Narrative   Not on file   Social Determinants of Health   Financial Resource Strain: Not on file  Food Insecurity: Not on file  Transportation Needs: Not on  file  Physical Activity: Not on file  Stress: Not on file  Social Connections: Not on file  Intimate Partner Violence: Not on file    Past Medical History, Surgical history, Social history, and Family history were reviewed and updated as appropriate.   Please see review of systems for further details on the patient's review from today.   Objective:   Physical Exam:  There were no vitals taken for this visit.  Physical Exam Constitutional:      General: She is not in acute distress. Musculoskeletal:        General: No deformity.  Neurological:     Mental Status: She is alert and oriented to person, place, and time.     Coordination: Coordination normal.  Psychiatric:        Attention and Perception: Attention and perception normal. She does not perceive auditory or visual hallucinations.        Mood and Affect: Mood normal. Mood is not anxious or depressed. Affect is not labile, blunt, angry or inappropriate.        Speech: Speech normal.        Behavior: Behavior normal.        Thought Content: Thought content normal. Thought content is not paranoid or delusional. Thought content does not include homicidal or suicidal ideation. Thought content does not include homicidal or suicidal plan.        Cognition and Memory: Cognition and memory normal.        Judgment: Judgment normal.     Comments: Insight intact     Lab Review:     Component Value Date/Time   NA 134 (L) 08/06/2018 0815   K 3.5 08/06/2018 0815   CL 100 08/06/2018 0815   CO2 26 08/06/2018 0815   GLUCOSE 163 (H) 08/06/2018 0815   BUN 13 08/06/2018 0815   CREATININE 0.78 08/06/2018 0815   CALCIUM 9.3 08/06/2018 0815   PROT 7.2 08/06/2018 0815   ALBUMIN 4.4 08/06/2018 0815   AST 15 08/06/2018 0815   ALT 15 08/06/2018 0815   ALKPHOS 77 08/06/2018 0815   BILITOT 1.4 (H) 08/06/2018 0815   GFRNONAA >60 08/06/2018 0815   GFRAA >60 08/06/2018 0815       Component Value Date/Time   WBC 22.3 (H) 08/06/2018  0815   RBC 5.29 (H) 08/06/2018 0815   HGB 16.5 (H) 08/06/2018 0815   HCT 46.6 (H) 08/06/2018 0815   PLT 201 08/06/2018 0815   MCV 88.1 08/06/2018 0815   MCH 31.2 08/06/2018 0815   MCHC 35.4 08/06/2018 0815   RDW 12.0 08/06/2018 0815    No results found for: POCLITH, LITHIUM   No results found for: PHENYTOIN, PHENOBARB, VALPROATE, CBMZ   .res Assessment: Plan:    Plan:  PDMP reviewed  Patient totally disabled at this  time. Patient is to remain out of work 07/22/2020 through 08/04/2020. Will determine a return to work date at next visit.   1. Cymbalta 90 to 120mg  daily - 60mg  BID. 2. Zolpidem 10mg  at hs - sleep  RTC 4 weeks  Patient advised to contact office with any questions, adverse effects, or acute worsening in signs and symptoms.   Diagnoses and all orders for this visit:  Major depressive disorder, recurrent episode, moderate (HCC) -     DULoxetine (CYMBALTA) 60 MG capsule; Take 1 capsule (60 mg total) by mouth 2 (two) times daily.  Insomnia, unspecified type -     zolpidem (AMBIEN) 10 MG tablet; Take 1 tablet (10 mg total) by mouth at bedtime as needed for sleep.  Generalized anxiety disorder -     DULoxetine (CYMBALTA) 60 MG capsule; Take 1 capsule (60 mg total) by mouth 2 (two) times daily.     Please see After Visit Summary for patient specific instructions.  Future Appointments  Date Time Provider Department Center  08/04/2020  8:40 AM Rayen Palen, , NP CP-CP None  10/27/2020  8:20 AM Aitana Burry, 10/02/2020, NP CP-[facility] None    No orders of the defined types were placed in this encounter.   -------------------------------

## 2020-07-23 ENCOUNTER — Telehealth: Payer: Self-pay | Admitting: Adult Health

## 2020-07-23 NOTE — Telephone Encounter (Signed)
FYI

## 2020-07-23 NOTE — Telephone Encounter (Signed)
Received fax from [facility] regarding Nicole [name] for completion of Health Care Provider Statement due by 08/07/20. Placed in Regina's box.

## 2020-08-04 ENCOUNTER — Encounter: Payer: Self-pay | Admitting: Adult Health

## 2020-08-04 ENCOUNTER — Telehealth (INDEPENDENT_AMBULATORY_CARE_PROVIDER_SITE_OTHER): Payer: BC Managed Care – PPO | Admitting: Adult Health

## 2020-08-04 DIAGNOSIS — G4700 Insomnia, unspecified: Secondary | ICD-10-CM

## 2020-08-04 DIAGNOSIS — F411 Generalized anxiety disorder: Secondary | ICD-10-CM

## 2020-08-04 DIAGNOSIS — F331 Major depressive disorder, recurrent, moderate: Secondary | ICD-10-CM | POA: Diagnosis not present

## 2020-08-04 NOTE — Progress Notes (Signed)
Nicole [name] [id number] 1961-01-12 60 y.o.  Virtual Visit via Video Note  I connected with pt @ on 08/04/20 at  8:40 AM EST by a video enabled telemedicine application and verified that I am speaking with the correct person using two identifiers.   I discussed the limitations of evaluation and management by telemedicine and the availability of in person appointments. The patient expressed understanding and agreed to proceed.  I discussed the assessment and treatment plan with the patient. The patient was provided an opportunity to ask questions and all were answered. The patient agreed with the plan and demonstrated an understanding of the instructions.   The patient was advised to call back or seek an in-person evaluation if the symptoms worsen or if the condition fails to improve as anticipated.  I provided 20 minutes of non-face-to-face time during this encounter.  The patient was located at home.  The provider was located at [place].   Aloha Gell, NP   Subjective:   Patient ID:  Nicole [name] is a 60 y.o. (DOB 07/10/1961) female.  Chief Complaint: No chief complaint on file.   HPI [name] presents for follow-up of MDD, GAD, and insomnia.  Describes mood today as "not good". Pleasant. Tearful at times. Mood symptoms - reports depression, anxiety, and irritability. Stating "I have Covid". Also stating "it's hard to say how I feel with having Covid". Has been at home for the past week. Feels tired and fatigued. Difficulties trying to "think". Does not feel capable of returning to work at this time. Decreased interest and motivation. Taking medications as prescribed.  Energy levels low. Active, does not have a regular exercise routine.  Unable to enjoy usual interests and activities. Single. Lives alone.  [name]  Appetite adequate. Weight loss - 5 pounds - 176 pounds. Sleeping most of the day and night. Focus and concentration difficulties. Completing  tasks. Managing aspects of household. Works full-time at [employer] [place].  Denies SI or HI.  Denies AH or VH.  Previous medication trials: Denies.   Review of Systems:  Review of Systems  Constitutional: Positive for activity change, appetite change, chills, fatigue and fever.  HENT: Positive for congestion and sinus pressure.   Musculoskeletal: Negative for gait problem.  Neurological: Negative for tremors.  Psychiatric/Behavioral:       Please refer to HPI    Medications: I have reviewed the patient's current medications.  Current Outpatient Medications  Medication Sig Dispense Refill  . cetirizine (ZYRTEC) 10 MG tablet Take 10 mg by mouth daily at 6 (six) AM.    . conjugated estrogens (PREMARIN) vaginal cream Place 0.625 mg vaginally 2 (two) times a week.    . cyclobenzaprine (FLEXERIL) 10 MG tablet Take 10 mg by mouth 3 (three) times daily.    . DULoxetine (CYMBALTA) 60 MG capsule Take 1 capsule (60 mg total) by mouth 2 (two) times daily. 60 capsule 2  . fluticasone (FLONASE) 50 MCG/ACT nasal spray Place 2 sprays into both nostrils daily at 6 (six) AM.    . gabapentin (NEURONTIN) 100 MG capsule Take 100 mg by mouth at bedtime as needed.    . gabapentin (NEURONTIN) 300 MG capsule Take 300 mg by mouth 3 (three) times daily.    . methocarbamol (ROBAXIN) 500 MG tablet Take 1 tablet (500 mg total) by mouth 2 (two) times daily. 20 tablet 0  . naproxen (NAPROSYN) 500 MG tablet Take 500 mg by mouth 2 (two) times daily.    [name]  traMADol (ULTRAM) 50 MG tablet Take 50 mg by mouth every 6 (six) hours as needed.    . zolpidem (AMBIEN) 10 MG tablet Take 1 tablet (10 mg total) by mouth at bedtime as needed for sleep. 30 tablet 0   No current facility-administered medications for this visit.    Medication Side Effects: None  Allergies:  Allergies  Allergen Reactions  . Ampicillin Anaphylaxis  . Codeine     hallucinations  . Hydrocodone-Acetaminophen Nausea And Vomiting  . Penicillins  Anaphylaxis and Hives    Past Medical History:  Diagnosis Date  . Chronic low back pain     No family history on file.  Social History   Socioeconomic History  . Marital status: Single    Spouse name: Not on file  . Number of children: Not on file  . Years of education: Not on file  . Highest education level: Not on file  Occupational History  . Not on file  Tobacco Use  . Smoking status: Never Smoker  . Smokeless tobacco: Never Used  Substance and Sexual Activity  . Alcohol use: No    Alcohol/week: 0.0 standard drinks  . Drug use: Not on file  . Sexual activity: Not on file  Other Topics Concern  . Not on file  Social History Narrative  . Not on file   Social Determinants of Health   Financial Resource Strain: Not on file  Food Insecurity: Not on file  Transportation Needs: Not on file  Physical Activity: Not on file  Stress: Not on file  Social Connections: Not on file  Intimate Partner Violence: Not on file    Past Medical History, Surgical history, Social history, and Family history were reviewed and updated as appropriate.   Please see review of systems for further details on the patient's review from today.   Objective:   Physical Exam:  There were no vitals taken for this visit.  Physical Exam Constitutional:      General: She is not in acute distress. Musculoskeletal:        General: No deformity.  Neurological:     Mental Status: She is alert and oriented to person, place, and time.     Coordination: Coordination normal.  Psychiatric:        Attention and Perception: Attention and perception normal. She does not perceive auditory or visual hallucinations.        Mood and Affect: Mood is anxious and depressed. Affect is flat. Affect is not labile, blunt, angry or inappropriate.        Speech: Speech normal.        Behavior: Behavior normal.        Thought Content: Thought content normal. Thought content is not paranoid or delusional. Thought  content does not include homicidal or suicidal ideation. Thought content does not include homicidal or suicidal plan.        Cognition and Memory: Cognition and memory normal.        Judgment: Judgment normal.     Comments: Insight intact     Lab Review:     Component Value Date/Time   NA 134 (L) 08/06/2018 0815   K 3.5 08/06/2018 0815   CL 100 08/06/2018 0815   CO2 26 08/06/2018 0815   GLUCOSE 163 (H) 08/06/2018 0815   BUN 13 08/06/2018 0815   CREATININE 0.78 08/06/2018 0815   CALCIUM 9.3 08/06/2018 0815   PROT 7.2 08/06/2018 0815   ALBUMIN 4.4 08/06/2018 0815   AST  15 08/06/2018 0815   ALT 15 08/06/2018 0815   ALKPHOS 77 08/06/2018 0815   BILITOT 1.4 (H) 08/06/2018 0815   GFRNONAA >60 08/06/2018 0815   GFRAA >60 08/06/2018 0815       Component Value Date/Time   WBC 22.3 (H) 08/06/2018 0815   RBC 5.29 (H) 08/06/2018 0815   HGB 16.5 (H) 08/06/2018 0815   HCT 46.6 (H) 08/06/2018 0815   PLT 201 08/06/2018 0815   MCV 88.1 08/06/2018 0815   MCH 31.2 08/06/2018 0815   MCHC 35.4 08/06/2018 0815   RDW 12.0 08/06/2018 0815    No results found for: POCLITH, LITHIUM   No results found for: PHENYTOIN, PHENOBARB, VALPROATE, CBMZ   .res Assessment: Plan:    Plan:  PDMP reviewed  Patient totally disabled at this time. Patient is to remain out of work 08/05/2019 through 08/14/2020. Will determine a return to work date at next visit.   1. Cymbalta 60mg  BID. 2. Zolpidem 10mg  at hs - sleep  RTC 10 days  Patient advised to contact office with any questions, adverse effects, or acute worsening in signs and symptoms.   Diagnoses and all orders for this visit:  Generalized anxiety disorder  Major depressive disorder, recurrent episode, moderate (HCC)  Insomnia, unspecified type     Please see After Visit Summary for patient specific instructions.  Future Appointments  Date Time Provider Department Center  10/27/2020  8:20 AM Burnie Hank, , NP CP-CP None     No orders of the defined types were placed in this encounter.     -------------------------------

## 2020-08-05 DIAGNOSIS — Z0289 Encounter for other administrative examinations: Secondary | ICD-10-CM

## 2020-08-05 NOTE — Telephone Encounter (Signed)
Noted  

## 2020-08-05 NOTE — Telephone Encounter (Signed)
Forms completed and will give to provider for review and signature.

## 2020-09-08 ENCOUNTER — Telehealth: Payer: Self-pay | Admitting: Adult Health

## 2020-09-08 NOTE — Telephone Encounter (Signed)
Nicole [name] called and requested that you call her- Today! She was mad about a bill she received showing she owed a NS fee in Dec for $50 and a forms fee in Jan for $35. She stated "I'm not paying those." " I also got a notice that I was going to be sent to collection" I explained to her the form fees and that there is a notice up on our check out window re these. I also explained that if she is overdue 90 days on her billing she will get a collection notice. That is not up to [place]. She stated she tried to pay at the end of Dec and the person here could not do it. I don't know anything about that except that if it was Vernona Rieger, she was new and probably could not do that yet. I apologized for that but told her anyone else could of taken her payment. She did pay $30 today for the two copays she had due.

## 2020-09-23 ENCOUNTER — Encounter: Payer: Self-pay | Admitting: Adult Health

## 2020-09-23 ENCOUNTER — Ambulatory Visit (INDEPENDENT_AMBULATORY_CARE_PROVIDER_SITE_OTHER): Payer: BC Managed Care – PPO | Admitting: Adult Health

## 2020-09-23 ENCOUNTER — Other Ambulatory Visit: Payer: Self-pay

## 2020-09-23 DIAGNOSIS — F411 Generalized anxiety disorder: Secondary | ICD-10-CM | POA: Diagnosis not present

## 2020-09-23 DIAGNOSIS — F331 Major depressive disorder, recurrent, moderate: Secondary | ICD-10-CM | POA: Diagnosis not present

## 2020-09-23 DIAGNOSIS — G4700 Insomnia, unspecified: Secondary | ICD-10-CM

## 2020-09-23 DIAGNOSIS — F410 Panic disorder [episodic paroxysmal anxiety] without agoraphobia: Secondary | ICD-10-CM | POA: Diagnosis not present

## 2020-09-23 NOTE — Progress Notes (Signed)
Nicole [name] [id number] 16-Feb-1961 60 y.o.  Subjective:   Patient ID:  Nicole [name] is a 60 y.o. (DOB Dec 28, 1960) female.  Chief Complaint: No chief complaint on file.   HPI RONIESHA HOLLINGSHEAD presents to the office today for follow-up of MDD, GAD, panic attacks and insomnia.  Describes mood today as "not good". Tearful throughout interview. Mood symptoms - reports depression, anxiety, and irritability. More "anxious" overall. Increased stress and anxiety. Having panic attacks. Increased blood pressure. Written out of work by PCP for 3 weeks for elevated blood pressure. Feels tired and fatigued. Feels symptoms are related to work environment. Feels "frozen" in job setting and cannot function. Increased stress, panic, and anxiety with being on the phone. Doesn't feel properly trained to do her job and feels "paralyzed". Stating "I can't continue to be under this much stress". Has worked at [employer] [place] for 15 years. Does not feel like she can work in current job position with her medical and mental health disabilities. Plans to request a transfer to a department more suitable to her functionality. Decreased interest and motivation. Taking medications as prescribed.   Energy levels lower. Active, does not have a regular exercise routine.  Unable to enjoy usual interests and activities. Single. Lives alone.   Appetite adequate. Weight loss - not eating - 176 pounds. Difficulties sleeping. Averages 3 to 4 hours of sleep. Not sleeping consistently. Focus and concentration difficulties. Completing tasks. Managing aspects of household. Works full-time at [employer] [place].  Denies SI or HI.  Denies AH or VH.  Previous medication trials: Denies.   Review of Systems:  Review of Systems  Musculoskeletal: Negative for gait problem.  Neurological: Negative for tremors.  Psychiatric/Behavioral:       Please refer to HPI    Medications: I have reviewed the patient's current  medications.  Current Outpatient Medications  Medication Sig Dispense Refill  . cetirizine (ZYRTEC) 10 MG tablet Take 10 mg by mouth daily at 6 (six) AM.    . conjugated estrogens (PREMARIN) vaginal cream Place 0.625 mg vaginally 2 (two) times a week.    . cyclobenzaprine (FLEXERIL) 10 MG tablet Take 10 mg by mouth 3 (three) times daily.    . DULoxetine (CYMBALTA) 60 MG capsule Take 1 capsule (60 mg total) by mouth 2 (two) times daily. 60 capsule 2  . fluticasone (FLONASE) 50 MCG/ACT nasal spray Place 2 sprays into both nostrils daily at 6 (six) AM.    . gabapentin (NEURONTIN) 100 MG capsule Take 100 mg by mouth at bedtime as needed.    . gabapentin (NEURONTIN) 300 MG capsule Take 300 mg by mouth 3 (three) times daily.    . methocarbamol (ROBAXIN) 500 MG tablet Take 1 tablet (500 mg total) by mouth 2 (two) times daily. 20 tablet 0  . naproxen (NAPROSYN) 500 MG tablet Take 500 mg by mouth 2 (two) times daily.    . traMADol (ULTRAM) 50 MG tablet Take 50 mg by mouth every 6 (six) hours as needed.    . zolpidem (AMBIEN) 10 MG tablet Take 1 tablet (10 mg total) by mouth at bedtime as needed for sleep. 30 tablet 0   No current facility-administered medications for this visit.    Medication Side Effects: None  Allergies:  Allergies  Allergen Reactions  . Ampicillin Anaphylaxis  . Codeine     hallucinations  . Hydrocodone-Acetaminophen Nausea And Vomiting  . Penicillins Anaphylaxis and Hives    Past Medical History:  Diagnosis Date  .  Chronic low back pain     No family history on file.  Social History   Socioeconomic History  . Marital status: Single    Spouse name: Not on file  . Number of children: Not on file  . Years of education: Not on file  . Highest education level: Not on file  Occupational History  . Not on file  Tobacco Use  . Smoking status: Never Smoker  . Smokeless tobacco: Never Used  Substance and Sexual Activity  . Alcohol use: No    Alcohol/week: 0.0  standard drinks  . Drug use: Not on file  . Sexual activity: Not on file  Other Topics Concern  . Not on file  Social History Narrative  . Not on file   Social Determinants of Health   Financial Resource Strain: Not on file  Food Insecurity: Not on file  Transportation Needs: Not on file  Physical Activity: Not on file  Stress: Not on file  Social Connections: Not on file  Intimate Partner Violence: Not on file    Past Medical History, Surgical history, Social history, and Family history were reviewed and updated as appropriate.   Please see review of systems for further details on the patient's review from today.   Objective:   Physical Exam:  There were no vitals taken for this visit.  Physical Exam Constitutional:      General: She is not in acute distress. Musculoskeletal:        General: No deformity.  Neurological:     Mental Status: She is alert and oriented to person, place, and time.     Coordination: Coordination normal.  Psychiatric:        Attention and Perception: Attention and perception normal. She does not perceive auditory or visual hallucinations.        Mood and Affect: Mood normal. Mood is not anxious or depressed. Affect is not labile, blunt, angry or inappropriate.        Speech: Speech normal.        Behavior: Behavior normal.        Thought Content: Thought content normal. Thought content is not paranoid or delusional. Thought content does not include homicidal or suicidal ideation. Thought content does not include homicidal or suicidal plan.        Cognition and Memory: Cognition and memory normal.        Judgment: Judgment normal.     Comments: Insight intact     Lab Review:     Component Value Date/Time   NA 134 (L) 08/06/2018 0815   K 3.5 08/06/2018 0815   CL 100 08/06/2018 0815   CO2 26 08/06/2018 0815   GLUCOSE 163 (H) 08/06/2018 0815   BUN 13 08/06/2018 0815   CREATININE 0.78 08/06/2018 0815   CALCIUM 9.3 08/06/2018 0815   PROT  7.2 08/06/2018 0815   ALBUMIN 4.4 08/06/2018 0815   AST 15 08/06/2018 0815   ALT 15 08/06/2018 0815   ALKPHOS 77 08/06/2018 0815   BILITOT 1.4 (H) 08/06/2018 0815   GFRNONAA >60 08/06/2018 0815   GFRAA >60 08/06/2018 0815       Component Value Date/Time   WBC 22.3 (H) 08/06/2018 0815   RBC 5.29 (H) 08/06/2018 0815   HGB 16.5 (H) 08/06/2018 0815   HCT 46.6 (H) 08/06/2018 0815   PLT 201 08/06/2018 0815   MCV 88.1 08/06/2018 0815   MCH 31.2 08/06/2018 0815   MCHC 35.4 08/06/2018 0815   RDW 12.0 08/06/2018  0815    No results found for: POCLITH, LITHIUM   No results found for: PHENYTOIN, PHENOBARB, VALPROATE, CBMZ   .res Assessment: Plan:     Plan:  PDMP reviewed  Patient totally disabled at this time. Patient is to remain out of work 09/23/2020 through 10/21/2020. Will determine a return to work date at next visit.   1. Cymbalta 60mg  BID. 2. Zolpidem 10mg  at hs - sleep  Patient advised to contact office with any questions, adverse effects, or acute worsening in signs and symptoms.   Diagnoses and all orders for this visit:  Insomnia, unspecified type  Major depressive disorder, recurrent episode, moderate (HCC)  Generalized anxiety disorder  Panic attacks     Please see After Visit Summary for patient specific instructions.  Future Appointments  Date Time Provider Department Center  10/17/2020 10:40 AM Trulee Hamstra, , NP CP-CP None    No orders of the defined types were placed in this encounter.   -------------------------------

## 2020-10-17 ENCOUNTER — Ambulatory Visit (INDEPENDENT_AMBULATORY_CARE_PROVIDER_SITE_OTHER): Payer: BC Managed Care – PPO | Admitting: Adult Health

## 2020-10-17 ENCOUNTER — Telehealth: Payer: Self-pay | Admitting: Adult Health

## 2020-10-17 ENCOUNTER — Encounter: Payer: Self-pay | Admitting: Adult Health

## 2020-10-17 ENCOUNTER — Other Ambulatory Visit: Payer: Self-pay

## 2020-10-17 DIAGNOSIS — F410 Panic disorder [episodic paroxysmal anxiety] without agoraphobia: Secondary | ICD-10-CM

## 2020-10-17 DIAGNOSIS — F331 Major depressive disorder, recurrent, moderate: Secondary | ICD-10-CM | POA: Diagnosis not present

## 2020-10-17 DIAGNOSIS — G4700 Insomnia, unspecified: Secondary | ICD-10-CM | POA: Diagnosis not present

## 2020-10-17 DIAGNOSIS — F411 Generalized anxiety disorder: Secondary | ICD-10-CM

## 2020-10-17 MED ORDER — ZOLPIDEM TARTRATE 10 MG PO TABS: 10.0000 mg | ORAL_TABLET | Freq: Every evening | ORAL | 2 refills | Status: DC | PRN

## 2020-10-17 NOTE — Telephone Encounter (Signed)
Received fax from [facility] regarding Reather Laurence concerning completion of Health Care Provider Statement. Placed on Traci's desk.

## 2020-10-17 NOTE — Progress Notes (Signed)
Nicole [name] [id number] 01-Nov-1960 60 y.o.  Subjective:   Patient ID:  Nicole [name] is a 60 y.o. (DOB Jan 15, 1961) female.  Chief Complaint: No chief complaint on file.   HPI Nicole [name] presents to the office today for follow-up of MDD, GAD, panic attacks and insomnia.  Describes mood today as "better than it has been, but not where it needs to be". Tearful. Still very emotional and tearful. Has feelings of "dread getting up in the morning". Mood symptoms - reports depression, anxiety, and irritability. Decreased panic attacks - "still getting hysterical and crying". Triggered earlier this week by work call and had a "meltdown". Was advised by [employer] that "I need to return to work and have an MD sign off on her paperwork or I won't get paid". Feels like her recovery has been difficult with the increase in stressors related to returning to position. Stating "I have worked there for 15 years now, I have been a good employee". Stating "I am not ready to return to work with my emotional instability". Decreased interest and motivation. Taking medications as prescribed.  Energy levels lower. Active, does not have a regular exercise routine.  Unable to enjoy usual interests and activities. Single. Lives alone.   Appetite decreased . Weight loss - 170 from 176 pounds. Difficulties sleeping. Averages 3 to 4 hours of sleep. Feels tired and fatigued. Focus and concentration difficulties. Completing tasks. Managing aspects of household. Works full-time at [employer] [place].  Denies SI or HI.  Denies AH or VH.  Previous medication trials: Denies.    Review of Systems:  Review of Systems  Musculoskeletal: Negative for gait problem.  Neurological: Negative for tremors.  Psychiatric/Behavioral:       Please refer to HPI    Medications: I have reviewed the patient's current medications.  Current Outpatient Medications  Medication Sig Dispense Refill  . cetirizine (ZYRTEC) 10 MG  tablet Take 10 mg by mouth daily at 6 (six) AM.    . conjugated estrogens (PREMARIN) vaginal cream Place 0.625 mg vaginally 2 (two) times a week.    . cyclobenzaprine (FLEXERIL) 10 MG tablet Take 10 mg by mouth 3 (three) times daily.    . DULoxetine (CYMBALTA) 60 MG capsule Take 1 capsule (60 mg total) by mouth 2 (two) times daily. 60 capsule 2  . fluticasone (FLONASE) 50 MCG/ACT nasal spray Place 2 sprays into both nostrils daily at 6 (six) AM.    . gabapentin (NEURONTIN) 100 MG capsule Take 100 mg by mouth at bedtime as needed.    . gabapentin (NEURONTIN) 300 MG capsule Take 300 mg by mouth 3 (three) times daily.    . methocarbamol (ROBAXIN) 500 MG tablet Take 1 tablet (500 mg total) by mouth 2 (two) times daily. 20 tablet 0  . naproxen (NAPROSYN) 500 MG tablet Take 500 mg by mouth 2 (two) times daily.    . traMADol (ULTRAM) 50 MG tablet Take 50 mg by mouth every 6 (six) hours as needed.    . zolpidem (AMBIEN) 10 MG tablet Take 1 tablet (10 mg total) by mouth at bedtime as needed for sleep. 30 tablet 2   No current facility-administered medications for this visit.    Medication Side Effects: None  Allergies:  Allergies  Allergen Reactions  . Ampicillin Anaphylaxis  . Codeine Nausea And Vomiting    hallucinations hallucinations hallucinations   . Hydrocodone-Acetaminophen Nausea And Vomiting  . Hydrocodone-Acetaminophen Nausea And Vomiting  . Penicillins Anaphylaxis and Hives  Past Medical History:  Diagnosis Date  . Chronic low back pain     No family history on file.  Social History   Socioeconomic History  . Marital status: Single    Spouse name: Not on file  . Number of children: Not on file  . Years of education: Not on file  . Highest education level: Not on file  Occupational History  . Not on file  Tobacco Use  . Smoking status: Never Smoker  . Smokeless tobacco: Never Used  Substance and Sexual Activity  . Alcohol use: No    Alcohol/week: 0.0 standard  drinks  . Drug use: Not on file  . Sexual activity: Not on file  Other Topics Concern  . Not on file  Social History Narrative  . Not on file   Social Determinants of Health   Financial Resource Strain: Not on file  Food Insecurity: Not on file  Transportation Needs: Not on file  Physical Activity: Not on file  Stress: Not on file  Social Connections: Not on file  Intimate Partner Violence: Not on file    Past Medical History, Surgical history, Social history, and Family history were reviewed and updated as appropriate.   Please see review of systems for further details on the patient's review from today.   Objective:   Physical Exam:  There were no vitals taken for this visit.  Physical Exam Constitutional:      General: She is not in acute distress. Musculoskeletal:        General: No deformity.  Neurological:     Mental Status: She is alert and oriented to person, place, and time.     Coordination: Coordination normal.  Psychiatric:        Attention and Perception: Attention and perception normal. She does not perceive auditory or visual hallucinations.        Mood and Affect: Mood is anxious and depressed. Affect is flat and tearful. Affect is not labile, blunt, angry or inappropriate.        Speech: Speech normal.        Behavior: Behavior normal.        Thought Content: Thought content normal. Thought content is not paranoid or delusional. Thought content does not include homicidal or suicidal ideation. Thought content does not include homicidal or suicidal plan.        Cognition and Memory: Cognition and memory normal.        Judgment: Judgment normal.     Comments: Insight intact     Lab Review:     Component Value Date/Time   NA 134 (L) 08/06/2018 0815   K 3.5 08/06/2018 0815   CL 100 08/06/2018 0815   CO2 26 08/06/2018 0815   GLUCOSE 163 (H) 08/06/2018 0815   BUN 13 08/06/2018 0815   CREATININE 0.78 08/06/2018 0815   CALCIUM 9.3 08/06/2018 0815    PROT 7.2 08/06/2018 0815   ALBUMIN 4.4 08/06/2018 0815   AST 15 08/06/2018 0815   ALT 15 08/06/2018 0815   ALKPHOS 77 08/06/2018 0815   BILITOT 1.4 (H) 08/06/2018 0815   GFRNONAA >60 08/06/2018 0815   GFRAA >60 08/06/2018 0815       Component Value Date/Time   WBC 22.3 (H) 08/06/2018 0815   RBC 5.29 (H) 08/06/2018 0815   HGB 16.5 (H) 08/06/2018 0815   HCT 46.6 (H) 08/06/2018 0815   PLT 201 08/06/2018 0815   MCV 88.1 08/06/2018 0815   MCH 31.2 08/06/2018 0815  MCHC 35.4 08/06/2018 0815   RDW 12.0 08/06/2018 0815    No results found for: POCLITH, LITHIUM   No results found for: PHENYTOIN, PHENOBARB, VALPROATE, CBMZ   .res Assessment: Plan:    Plan:  PDMP reviewed  Patient totally disabled at this time. Patient is to remain out of work 10/17/2020  through 04/15//2022. Will determine a return to work date at next visit.   1. Cymbalta 60mg  BID. 2. Zolpidem 10mg  at hs - sleep  Patient advised to contact office with any questions, adverse effects, or acute worsening in signs and symptoms.   Diagnoses and all orders for this visit:  Panic attacks  Insomnia, unspecified type -     zolpidem (AMBIEN) 10 MG tablet; Take 1 tablet (10 mg total) by mouth at bedtime as needed for sleep.  Generalized anxiety disorder  Major depressive disorder, recurrent episode, moderate (HCC)     Please see After Visit Summary for patient specific instructions.  No future appointments.  No orders of the defined types were placed in this encounter.   -------------------------------

## 2020-10-22 ENCOUNTER — Telehealth: Payer: Self-pay | Admitting: Adult Health

## 2020-10-22 NOTE — Telephone Encounter (Signed)
Please review

## 2020-10-22 NOTE — Telephone Encounter (Signed)
Kathryne Gin called and wanted me to tell you to please be sure that you have Dr. Jennelle Human stamp his signature on the form. Loletta Parish is really giving her a hard time about the forms.  Thanks

## 2020-10-27 ENCOUNTER — Ambulatory Visit: Payer: BC Managed Care – PPO | Admitting: Adult Health

## 2020-10-28 DIAGNOSIS — Z0289 Encounter for other administrative examinations: Secondary | ICD-10-CM

## 2020-10-28 NOTE — Telephone Encounter (Signed)
FMLA pw completed and given to Dr. Jennelle Human to sign

## 2020-10-29 ENCOUNTER — Telehealth: Payer: Self-pay | Admitting: Psychiatry

## 2020-10-29 NOTE — Telephone Encounter (Signed)
Pt left a message stating that she wants to know if her fmla has been faxed and the date it was faxed. She also wants to know if there is a charge for the paperwork.Please give her a call at [phone]

## 2020-10-29 NOTE — Telephone Encounter (Signed)
It was left yesterday for Dr. Jennelle Human to sign.

## 2020-10-29 NOTE — Telephone Encounter (Signed)
Traci please review

## 2020-10-30 NOTE — Telephone Encounter (Signed)
Pt informed

## 2020-11-03 ENCOUNTER — Other Ambulatory Visit: Payer: Self-pay | Admitting: Adult Health

## 2020-11-03 DIAGNOSIS — F331 Major depressive disorder, recurrent, moderate: Secondary | ICD-10-CM

## 2020-11-03 DIAGNOSIS — F411 Generalized anxiety disorder: Secondary | ICD-10-CM

## 2020-11-11 ENCOUNTER — Telehealth: Payer: Self-pay | Admitting: Adult Health

## 2020-11-11 NOTE — Telephone Encounter (Signed)
Please review

## 2020-11-11 NOTE — Telephone Encounter (Signed)
Pt left message about her short term disability being denied. She would like to talk to gina. She also wants her appointment cancelled on 4 /26 but wants a phone call first. Please call her at [phone]

## 2020-11-11 NOTE — Telephone Encounter (Signed)
Can we follow up with her?

## 2020-11-18 ENCOUNTER — Ambulatory Visit: Payer: BC Managed Care – PPO | Admitting: Adult Health

## 2020-11-19 NOTE — Telephone Encounter (Signed)
Just returning from vacation but will f/u with pt

## 2020-11-24 ENCOUNTER — Other Ambulatory Visit: Payer: Self-pay

## 2020-11-24 ENCOUNTER — Ambulatory Visit (INDEPENDENT_AMBULATORY_CARE_PROVIDER_SITE_OTHER): Payer: BC Managed Care – PPO | Admitting: Adult Health

## 2020-11-24 DIAGNOSIS — F331 Major depressive disorder, recurrent, moderate: Secondary | ICD-10-CM | POA: Diagnosis not present

## 2020-11-24 DIAGNOSIS — F410 Panic disorder [episodic paroxysmal anxiety] without agoraphobia: Secondary | ICD-10-CM | POA: Diagnosis not present

## 2020-11-24 DIAGNOSIS — G4700 Insomnia, unspecified: Secondary | ICD-10-CM

## 2020-11-24 DIAGNOSIS — F411 Generalized anxiety disorder: Secondary | ICD-10-CM

## 2020-11-24 NOTE — Telephone Encounter (Signed)
Pt will be in tomorrow to discuss with nurse

## 2020-11-24 NOTE — Progress Notes (Signed)
[name] [id number] July 14, 1961 60 y.o.  Subjective:   Patient ID:  Nicole [name] is a 60 y.o. (DOB 09/18/1960) female.  Chief Complaint: No chief complaint on file.   HPI Nicole [name] presents to the office today for follow-up of MDD, GAD, panic attacks and insomnia.  Describes mood today as "stressed". Decreased tearfulness. Mood symptoms - reports depression, anxiety, and irritability. Increased panic attacks. Reports increased stress levels associated with work and insurance issues associated with her time away from work. Stating "I'm trying to recover emotionally, and the lack of support by my employer and insurance company have made it difficult for me". On the phone for 4 hours a week with insurance company and work. Dealing with high blood pressure. Does not feel capable of returning to work at this time. Decreased interest and motivation. Taking medications as prescribed.  Energy levels lower. Active, does not have a regular exercise routine.  Unable to enjoy usual interests and activities. Single. Lives alone. Involved in church. Family in [place]. Appetite improved. Weight gain - 170 to 178 pounds. Sleep has improved. Averages 5 to 6 hours of sleep.   Focus and concentration difficulties - "not as bad as it was". Completing tasks. Managing aspects of household. Works full-time at [employer] - out of work on leave currently.  Denies SI or HI.  Denies AH or VH.  Previous medication trials: Denies.   Review of Systems:  Review of Systems  Musculoskeletal: Negative for gait problem.  Neurological: Negative for tremors.  Psychiatric/Behavioral:       Please refer to HPI    Medications: I have reviewed the patient's current medications.  Current Outpatient Medications  Medication Sig Dispense Refill  . cetirizine (ZYRTEC) 10 MG tablet Take 10 mg by mouth daily at 6 (six) AM.    . conjugated estrogens (PREMARIN) vaginal cream Place 0.625 mg vaginally 2  (two) times a week.    . cyclobenzaprine (FLEXERIL) 10 MG tablet Take 10 mg by mouth 3 (three) times daily.    . DULoxetine (CYMBALTA) 60 MG capsule TAKE 1 CAPSULE (60 MG TOTAL) BY MOUTH 2 (TWO) TIMES DAILY. 60 capsule 0  . fluticasone (FLONASE) 50 MCG/ACT nasal spray Place 2 sprays into both nostrils daily at 6 (six) AM.    . gabapentin (NEURONTIN) 100 MG capsule Take 100 mg by mouth at bedtime as needed.    . gabapentin (NEURONTIN) 300 MG capsule Take 300 mg by mouth 3 (three) times daily.    . methocarbamol (ROBAXIN) 500 MG tablet Take 1 tablet (500 mg total) by mouth 2 (two) times daily. 20 tablet 0  . naproxen (NAPROSYN) 500 MG tablet Take 500 mg by mouth 2 (two) times daily.    . traMADol (ULTRAM) 50 MG tablet Take 50 mg by mouth every 6 (six) hours as needed.    . zolpidem (AMBIEN) 10 MG tablet Take 1 tablet (10 mg total) by mouth at bedtime as needed for sleep. 30 tablet 2   No current facility-administered medications for this visit.    Medication Side Effects: None  Allergies:  Allergies  Allergen Reactions  . Ampicillin Anaphylaxis  . Codeine Nausea And Vomiting    hallucinations hallucinations hallucinations   . Hydrocodone-Acetaminophen Nausea And Vomiting  . Hydrocodone-Acetaminophen Nausea And Vomiting  . Penicillins Anaphylaxis and Hives    Past Medical History:  Diagnosis Date  . Chronic low back pain     Past Medical History, Surgical history, Social history, and Family  history were reviewed and updated as appropriate.   Please see review of systems for further details on the patient's review from today.   Objective:   Physical Exam:  There were no vitals taken for this visit.  Physical Exam Constitutional:      General: She is not in acute distress. Musculoskeletal:        General: No deformity.  Neurological:     Mental Status: She is alert and oriented to person, place, and time.     Coordination: Coordination normal.  Psychiatric:         Attention and Perception: Attention and perception normal. She does not perceive auditory or visual hallucinations.        Mood and Affect: Mood normal. Mood is not anxious or depressed. Affect is not labile, blunt, angry or inappropriate.        Speech: Speech normal.        Behavior: Behavior normal.        Thought Content: Thought content normal. Thought content is not paranoid or delusional. Thought content does not include homicidal or suicidal ideation. Thought content does not include homicidal or suicidal plan.        Cognition and Memory: Cognition and memory normal.        Judgment: Judgment normal.     Comments: Insight intact     Lab Review:     Component Value Date/Time   NA 134 (L) 08/06/2018 0815   K 3.5 08/06/2018 0815   CL 100 08/06/2018 0815   CO2 26 08/06/2018 0815   GLUCOSE 163 (H) 08/06/2018 0815   BUN 13 08/06/2018 0815   CREATININE 0.78 08/06/2018 0815   CALCIUM 9.3 08/06/2018 0815   PROT 7.2 08/06/2018 0815   ALBUMIN 4.4 08/06/2018 0815   AST 15 08/06/2018 0815   ALT 15 08/06/2018 0815   ALKPHOS 77 08/06/2018 0815   BILITOT 1.4 (H) 08/06/2018 0815   GFRNONAA >60 08/06/2018 0815   GFRAA >60 08/06/2018 0815       Component Value Date/Time   WBC 22.3 (H) 08/06/2018 0815   RBC 5.29 (H) 08/06/2018 0815   HGB 16.5 (H) 08/06/2018 0815   HCT 46.6 (H) 08/06/2018 0815   PLT 201 08/06/2018 0815   MCV 88.1 08/06/2018 0815   MCH 31.2 08/06/2018 0815   MCHC 35.4 08/06/2018 0815   RDW 12.0 08/06/2018 0815    No results found for: POCLITH, LITHIUM   No results found for: PHENYTOIN, PHENOBARB, VALPROATE, CBMZ   .res Assessment: Plan:     Plan:  PDMP reviewed   Patient is to remain out of work 11/14/2020  through 12/22/2020. Will determine a return to work date at next visit.   1. Cymbalta 60mg  BID. 2. Zolpidem 10mg  at hs - sleep  Patient advised to contact office with any questions, adverse effects, or acute worsening in signs and  symptoms.  Diagnoses and all orders for this visit:  Insomnia, unspecified type  Panic attacks  Major depressive disorder, recurrent episode, moderate (HCC)  Generalized anxiety disorder     Please see After Visit Summary for patient specific instructions.  No future appointments.  No orders of the defined types were placed in this encounter.   -------------------------------

## 2020-11-25 ENCOUNTER — Encounter: Payer: Self-pay | Admitting: Adult Health

## 2020-11-26 ENCOUNTER — Telehealth: Payer: Self-pay | Admitting: Adult Health

## 2020-11-26 NOTE — Telephone Encounter (Signed)
Please review

## 2020-11-26 NOTE — Telephone Encounter (Signed)
Nicole [name] called asking to speak to [name] about her disability paperwork. Her phone number is [phone].

## 2020-11-27 NOTE — Telephone Encounter (Signed)
This is an old message. Already spoke with her yesterday.

## 2020-12-03 DIAGNOSIS — Z0289 Encounter for other administrative examinations: Secondary | ICD-10-CM

## 2020-12-19 ENCOUNTER — Telehealth: Payer: Self-pay | Admitting: Adult Health

## 2020-12-19 NOTE — Telephone Encounter (Signed)
Please review

## 2020-12-19 NOTE — Telephone Encounter (Signed)
That will need to be scheduled.

## 2020-12-19 NOTE — Telephone Encounter (Signed)
Do we need to have admins schedule this?

## 2020-12-19 NOTE — Telephone Encounter (Signed)
Pt's other provider  Dr. Candise Che called and left a message that he would like to have a Dr to Dr review with Rene Kocher and Dr. Jennelle Human.  Please call [phone].

## 2020-12-22 ENCOUNTER — Ambulatory Visit (INDEPENDENT_AMBULATORY_CARE_PROVIDER_SITE_OTHER): Payer: BC Managed Care – PPO | Admitting: Adult Health

## 2020-12-22 ENCOUNTER — Encounter: Payer: Self-pay | Admitting: Adult Health

## 2020-12-22 ENCOUNTER — Other Ambulatory Visit: Payer: Self-pay

## 2020-12-22 DIAGNOSIS — F411 Generalized anxiety disorder: Secondary | ICD-10-CM | POA: Diagnosis not present

## 2020-12-22 DIAGNOSIS — F410 Panic disorder [episodic paroxysmal anxiety] without agoraphobia: Secondary | ICD-10-CM | POA: Diagnosis not present

## 2020-12-22 DIAGNOSIS — F331 Major depressive disorder, recurrent, moderate: Secondary | ICD-10-CM | POA: Diagnosis not present

## 2020-12-22 DIAGNOSIS — G4700 Insomnia, unspecified: Secondary | ICD-10-CM | POA: Diagnosis not present

## 2020-12-22 MED ORDER — DULOXETINE HCL 60 MG PO CPEP: 60.0000 mg | ORAL_CAPSULE | Freq: Two times a day (BID) | ORAL | 5 refills | Status: DC

## 2020-12-22 MED ORDER — ZOLPIDEM TARTRATE 10 MG PO TABS
10.0000 mg | ORAL_TABLET | Freq: Every evening | ORAL | 2 refills | Status: DC | PRN
Start: 2020-12-22 — End: 2021-01-26

## 2020-12-22 NOTE — Progress Notes (Signed)
Nicole [name] [id number] 11-Nov-1960 60 y.o.  Subjective:   Patient ID:  Nicole [name] is a 60 y.o. (DOB 08/22/1960) female.  Chief Complaint: No chief complaint on file.   HPI Nicole [name] presents to the office today for follow-up of MDD, GAD, panic attacks and insomnia.  Describes mood today as "stressed". Decreased tearfulness. Mood symptoms - reports decreased depression, anxiety, and irritability. Decreased panic attacks from last month - "I spent a lot of time on the phone trying to get help". Stating "I am feeling better about things". Has been working with employer to return to work - and they have developed a plan outlining some restrictions which will enable her to be placed in a job that  that will support her mental health and emotional needs. Improved interest and motivation. Taking medications as prescribed.  Energy level is better. Active, has been walking 3 to 4 days a week from 2 and 1/2 to 4 miles a day. Is able to enjoy usual interests and activities. Single. Lives alone. Involved in church. Family in [place]. Appetite improved. Weight gain - 177 pounds. Sleep has improved. Averages 6 hours of sleep.   Focus and concentration improved. Reading. Completing tasks. Managing aspects of household. Works full-time at [employer] - out of work on leave currently.  Denies SI or HI.  Denies AH or VH.  Previous medication trials: Denies.    Review of Systems:  Review of Systems  Musculoskeletal: Negative for gait problem.  Neurological: Negative for tremors.  Psychiatric/Behavioral:       Please refer to HPI    Medications: I have reviewed the patient's current medications.  Current Outpatient Medications  Medication Sig Dispense Refill  . cetirizine (ZYRTEC) 10 MG tablet Take 10 mg by mouth daily at 6 (six) AM.    . conjugated estrogens (PREMARIN) vaginal cream Place 0.625 mg vaginally 2 (two) times a week.    . cyclobenzaprine (FLEXERIL) 10 MG tablet  Take 10 mg by mouth 3 (three) times daily.    . DULoxetine (CYMBALTA) 60 MG capsule Take 1 capsule (60 mg total) by mouth 2 (two) times daily. 60 capsule 5  . fluticasone (FLONASE) 50 MCG/ACT nasal spray Place 2 sprays into both nostrils daily at 6 (six) AM.    . gabapentin (NEURONTIN) 100 MG capsule Take 100 mg by mouth at bedtime as needed.    . gabapentin (NEURONTIN) 300 MG capsule Take 300 mg by mouth 3 (three) times daily.    . methocarbamol (ROBAXIN) 500 MG tablet Take 1 tablet (500 mg total) by mouth 2 (two) times daily. 20 tablet 0  . naproxen (NAPROSYN) 500 MG tablet Take 500 mg by mouth 2 (two) times daily.    . traMADol (ULTRAM) 50 MG tablet Take 50 mg by mouth every 6 (six) hours as needed.    . zolpidem (AMBIEN) 10 MG tablet Take 1 tablet (10 mg total) by mouth at bedtime as needed for sleep. 30 tablet 2   No current facility-administered medications for this visit.    Medication Side Effects: None  Allergies:  Allergies  Allergen Reactions  . Ampicillin Anaphylaxis  . Codeine Nausea And Vomiting    hallucinations hallucinations hallucinations   . Hydrocodone-Acetaminophen Nausea And Vomiting  . Hydrocodone-Acetaminophen Nausea And Vomiting  . Penicillins Anaphylaxis and Hives    Past Medical History:  Diagnosis Date  . Chronic low back pain     Past Medical History, Surgical history, Social history, and Family history  were reviewed and updated as appropriate.   Please see review of systems for further details on the patient's review from today.   Objective:   Physical Exam:  There were no vitals taken for this visit.  Physical Exam Constitutional:      General: She is not in acute distress. Musculoskeletal:        General: No deformity.  Neurological:     Mental Status: She is alert and oriented to person, place, and time.     Coordination: Coordination normal.  Psychiatric:        Attention and Perception: Attention and perception normal. She does  not perceive auditory or visual hallucinations.        Mood and Affect: Mood normal. Mood is not anxious or depressed. Affect is not labile, blunt, angry or inappropriate.        Speech: Speech normal.        Behavior: Behavior normal.        Thought Content: Thought content normal. Thought content is not paranoid or delusional. Thought content does not include homicidal or suicidal ideation. Thought content does not include homicidal or suicidal plan.        Cognition and Memory: Cognition and memory normal.        Judgment: Judgment normal.     Comments: Insight intact     Lab Review:     Component Value Date/Time   NA 134 (L) 08/06/2018 0815   K 3.5 08/06/2018 0815   CL 100 08/06/2018 0815   CO2 26 08/06/2018 0815   GLUCOSE 163 (H) 08/06/2018 0815   BUN 13 08/06/2018 0815   CREATININE 0.78 08/06/2018 0815   CALCIUM 9.3 08/06/2018 0815   PROT 7.2 08/06/2018 0815   ALBUMIN 4.4 08/06/2018 0815   AST 15 08/06/2018 0815   ALT 15 08/06/2018 0815   ALKPHOS 77 08/06/2018 0815   BILITOT 1.4 (H) 08/06/2018 0815   GFRNONAA >60 08/06/2018 0815   GFRAA >60 08/06/2018 0815       Component Value Date/Time   WBC 22.3 (H) 08/06/2018 0815   RBC 5.29 (H) 08/06/2018 0815   HGB 16.5 (H) 08/06/2018 0815   HCT 46.6 (H) 08/06/2018 0815   PLT 201 08/06/2018 0815   MCV 88.1 08/06/2018 0815   MCH 31.2 08/06/2018 0815   MCHC 35.4 08/06/2018 0815   RDW 12.0 08/06/2018 0815    No results found for: POCLITH, LITHIUM   No results found for: PHENYTOIN, PHENOBARB, VALPROATE, CBMZ   .res Assessment: Plan:    Plan:  PDMP reviewed  Patient may be released to return to work full time on 12/25/2020 with restrictions - these restrictions will be outlined and sent to employer in an attached letter.   1. Cymbalta 60mg  BID. 2. Zolpidem 10mg  at hs - sleep  Patient advised to contact office with any questions, adverse effects, or acute worsening in signs and symptoms.   Diagnoses and all  orders for this visit:  Panic attacks  Major depressive disorder, recurrent episode, moderate (HCC) -     DULoxetine (CYMBALTA) 60 MG capsule; Take 1 capsule (60 mg total) by mouth 2 (two) times daily.  Generalized anxiety disorder -     DULoxetine (CYMBALTA) 60 MG capsule; Take 1 capsule (60 mg total) by mouth 2 (two) times daily.  Insomnia, unspecified type -     zolpidem (AMBIEN) 10 MG tablet; Take 1 tablet (10 mg total) by mouth at bedtime as needed for sleep.  Please see After Visit Summary for patient specific instructions.  Future Appointments  Date Time Provider Department Center  01/26/2021 12:00 PM Florenda Watt, Thereasa Solo, NP [facility] None    No orders of the defined types were placed in this encounter.   -------------------------------

## 2020-12-24 ENCOUNTER — Encounter: Payer: Self-pay | Admitting: Adult Health

## 2020-12-31 NOTE — Telephone Encounter (Signed)
Has this been taken care of?

## 2021-01-01 NOTE — Telephone Encounter (Signed)
Discussed.

## 2021-01-26 ENCOUNTER — Other Ambulatory Visit: Payer: Self-pay

## 2021-01-26 ENCOUNTER — Encounter: Payer: Self-pay | Admitting: Adult Health

## 2021-01-26 ENCOUNTER — Ambulatory Visit: Payer: BC Managed Care – PPO | Admitting: Adult Health

## 2021-01-26 DIAGNOSIS — F411 Generalized anxiety disorder: Secondary | ICD-10-CM

## 2021-01-26 DIAGNOSIS — F331 Major depressive disorder, recurrent, moderate: Secondary | ICD-10-CM | POA: Diagnosis not present

## 2021-01-26 DIAGNOSIS — F410 Panic disorder [episodic paroxysmal anxiety] without agoraphobia: Secondary | ICD-10-CM

## 2021-01-26 DIAGNOSIS — G4700 Insomnia, unspecified: Secondary | ICD-10-CM

## 2021-01-26 MED ORDER — ZOLPIDEM TARTRATE 10 MG PO TABS: 10.0000 mg | ORAL_TABLET | Freq: Every evening | ORAL | 2 refills | Status: DC | PRN

## 2021-01-26 NOTE — Progress Notes (Signed)
Nicole [name] [id number] [birth year] 60 y.o.  Subjective:   Patient ID:  Nicole [name] is a 60 y.o. (DOB 07/16/1961) female.  Chief Complaint: No chief complaint on file.   HPI TYANNAH SANE presents to the office today for follow-up of MDD, GAD, panic attacks and insomnia.  Describes mood today as "stressed". Decreased tearfulness. Mood symptoms - denies depression, anxiety, and irritability. Denies panic attack. Stating "I am doing better". Improved interest and motivation. Taking medications as prescribed.  Energy level is better. Active, has a regular exercise routine. Walking.  Is able to enjoy usual interests and activities. Single. Lives alone. Involved in church. Family in [place]. Appetite improved. Weight loss - 172 to 175 pounds. Sleep has improved. Averages 6 hours of sleep.   Focus and concentration improved. Completing tasks. Managing aspects of household. Works full-time - [employer].   Denies SI or HI.  Denies AH or VH.  Previous medication trials: Denies.    Review of Systems:  Review of Systems  Musculoskeletal:  Negative for gait problem.  Neurological:  Negative for tremors.  Psychiatric/Behavioral:         Please refer to HPI   Medications: I have reviewed the patient's current medications.  Current Outpatient Medications  Medication Sig Dispense Refill   cetirizine (ZYRTEC) 10 MG tablet Take 10 mg by mouth daily at 6 (six) AM.     conjugated estrogens (PREMARIN) vaginal cream Place 0.625 mg vaginally 2 (two) times a week.     cyclobenzaprine (FLEXERIL) 10 MG tablet Take 10 mg by mouth 3 (three) times daily.     DULoxetine (CYMBALTA) 60 MG capsule Take 1 capsule (60 mg total) by mouth 2 (two) times daily. 60 capsule 5   fluticasone (FLONASE) 50 MCG/ACT nasal spray Place 2 sprays into both nostrils daily at 6 (six) AM.     gabapentin (NEURONTIN) 100 MG capsule Take 100 mg by mouth at bedtime as needed.     gabapentin (NEURONTIN) 300 MG capsule Take 300  mg by mouth 3 (three) times daily.     methocarbamol (ROBAXIN) 500 MG tablet Take 1 tablet (500 mg total) by mouth 2 (two) times daily. 20 tablet 0   naproxen (NAPROSYN) 500 MG tablet Take 500 mg by mouth 2 (two) times daily.     traMADol (ULTRAM) 50 MG tablet Take 50 mg by mouth every 6 (six) hours as needed.     zolpidem (AMBIEN) 10 MG tablet Take 1 tablet (10 mg total) by mouth at bedtime as needed for sleep. 30 tablet 2   No current facility-administered medications for this visit.    Medication Side Effects: None  Allergies:  Allergies  Allergen Reactions   Ampicillin Anaphylaxis   Codeine Nausea And Vomiting    hallucinations hallucinations hallucinations    Hydrocodone-Acetaminophen Nausea And Vomiting   Hydrocodone-Acetaminophen Nausea And Vomiting   Penicillins Anaphylaxis and Hives    Past Medical History:  Diagnosis Date   Chronic low back pain     Past Medical History, Surgical history, Social history, and Family history were reviewed and updated as appropriate.   Please see review of systems for further details on the patient's review from today.   Objective:   Physical Exam:  There were no vitals taken for this visit.  Physical Exam Constitutional:      General: She is not in acute distress. Musculoskeletal:        General: No deformity.  Neurological:     Mental Status: She is  alert and oriented to person, place, and time.     Coordination: Coordination normal.  Psychiatric:        Attention and Perception: Attention and perception normal. She does not perceive auditory or visual hallucinations.        Mood and Affect: Mood normal. Mood is not anxious or depressed. Affect is not labile, blunt, angry or inappropriate.        Speech: Speech normal.        Behavior: Behavior normal.        Thought Content: Thought content normal. Thought content is not paranoid or delusional. Thought content does not include homicidal or suicidal ideation. Thought  content does not include homicidal or suicidal plan.        Cognition and Memory: Cognition and memory normal.        Judgment: Judgment normal.     Comments: Insight intact    Lab Review:     Component Value Date/Time   NA 134 (L) 08/06/2018 0815   K 3.5 08/06/2018 0815   CL 100 08/06/2018 0815   CO2 26 08/06/2018 0815   GLUCOSE 163 (H) 08/06/2018 0815   BUN 13 08/06/2018 0815   CREATININE 0.78 08/06/2018 0815   CALCIUM 9.3 08/06/2018 0815   PROT 7.2 08/06/2018 0815   ALBUMIN 4.4 08/06/2018 0815   AST 15 08/06/2018 0815   ALT 15 08/06/2018 0815   ALKPHOS 77 08/06/2018 0815   BILITOT 1.4 (H) 08/06/2018 0815   GFRNONAA >60 08/06/2018 0815   GFRAA >60 08/06/2018 0815       Component Value Date/Time   WBC 22.3 (H) 08/06/2018 0815   RBC 5.29 (H) 08/06/2018 0815   HGB 16.5 (H) 08/06/2018 0815   HCT 46.6 (H) 08/06/2018 0815   PLT 201 08/06/2018 0815   MCV 88.1 08/06/2018 0815   MCH 31.2 08/06/2018 0815   MCHC 35.4 08/06/2018 0815   RDW 12.0 08/06/2018 0815    No results found for: POCLITH, LITHIUM   No results found for: PHENYTOIN, PHENOBARB, VALPROATE, CBMZ   .res Assessment: Plan:    Plan:  PDMP reviewed  1. Cymbalta 60mg  BID. 2. Zolpidem 10mg  at hs - sleep  Patient advised to contact office with any questions, adverse effects, or acute worsening in signs and symptoms.  RTC 3/6 months  Diagnoses and all orders for this visit:  Panic attacks  Insomnia, unspecified type -     zolpidem (AMBIEN) 10 MG tablet; Take 1 tablet (10 mg total) by mouth at bedtime as needed for sleep.  Major depressive disorder, recurrent episode, moderate (HCC)  Generalized anxiety disorder    Please see After Visit Summary for patient specific instructions.  No future appointments.   No orders of the defined types were placed in this encounter.   -------------------------------

## 2021-04-08 ENCOUNTER — Encounter (HOSPITAL_BASED_OUTPATIENT_CLINIC_OR_DEPARTMENT_OTHER): Payer: Self-pay | Admitting: *Deleted

## 2021-04-08 ENCOUNTER — Other Ambulatory Visit: Payer: Self-pay

## 2021-04-08 ENCOUNTER — Emergency Department (HOSPITAL_BASED_OUTPATIENT_CLINIC_OR_DEPARTMENT_OTHER)
Admission: EM | Admit: 2021-04-08 | Discharge: 2021-04-08 | Disposition: A | Discharge status: Home / Self Care | Payer: BC Managed Care – PPO | Attending: Emergency Medicine | Admitting: Emergency Medicine

## 2021-04-08 DIAGNOSIS — U071 COVID-19: Secondary | ICD-10-CM | POA: Insufficient documentation

## 2021-04-08 DIAGNOSIS — R197 Diarrhea, unspecified: Secondary | ICD-10-CM

## 2021-04-08 DIAGNOSIS — R112 Nausea with vomiting, unspecified: Secondary | ICD-10-CM

## 2021-04-08 LAB — CBC WITH DIFFERENTIAL/PLATELET
Abs Immature Granulocytes: 0.02 10*3/uL (ref 0.00–0.07)
Basophils Absolute: 0 10*3/uL (ref 0.0–0.1)
Basophils Relative: 0 %
Eosinophils Absolute: 0.1 10*3/uL (ref 0.0–0.5)
Eosinophils Relative: 1 %
HCT: 52.6 % — ABNORMAL HIGH (ref 36.0–46.0)
Hemoglobin: 18.5 g/dL — ABNORMAL HIGH (ref 12.0–15.0)
Immature Granulocytes: 0 %
Lymphocytes Relative: 33 %
Lymphs Abs: 3.1 10*3/uL (ref 0.7–4.0)
MCH: 31.4 pg (ref 26.0–34.0)
MCHC: 35.2 g/dL (ref 30.0–36.0)
MCV: 89.3 fL (ref 80.0–100.0)
Monocytes Absolute: 0.5 10*3/uL (ref 0.1–1.0)
Monocytes Relative: 5 %
Neutro Abs: 5.9 10*3/uL (ref 1.7–7.7)
Neutrophils Relative %: 61 %
Platelets: 276 10*3/uL (ref 150–400)
RBC: 5.89 MIL/uL — ABNORMAL HIGH (ref 3.87–5.11)
RDW: 11.9 % (ref 11.5–15.5)
WBC: 9.6 10*3/uL (ref 4.0–10.5)
nRBC: 0 % (ref 0.0–0.2)

## 2021-04-08 LAB — COMPREHENSIVE METABOLIC PANEL
ALT: 31 U/L (ref 0–44)
AST: 22 U/L (ref 15–41)
Albumin: 4.9 g/dL (ref 3.5–5.0)
Alkaline Phosphatase: 88 U/L (ref 38–126)
Anion gap: 9 (ref 5–15)
BUN: 16 mg/dL (ref 6–20)
CO2: 30 mmol/L (ref 22–32)
Calcium: 10.1 mg/dL (ref 8.9–10.3)
Chloride: 102 mmol/L (ref 98–111)
Creatinine, Ser: 0.99 mg/dL (ref 0.44–1.00)
GFR, Estimated: >60 mL/min (ref >60)
Glucose, Bld: 110 mg/dL — ABNORMAL HIGH (ref 70–99)
Potassium: 4.1 mmol/L (ref 3.5–5.1)
Sodium: 141 mmol/L (ref 135–145)
Total Bilirubin: 0.7 mg/dL (ref 0.3–1.2)
Total Protein: 8.2 g/dL — ABNORMAL HIGH (ref 6.5–8.1)

## 2021-04-08 LAB — URINALYSIS, ROUTINE W REFLEX MICROSCOPIC
Bilirubin Urine: NEGATIVE
Glucose, UA: NEGATIVE mg/dL
Hgb urine dipstick: NEGATIVE
Ketones, ur: 40 mg/dL — AB
Leukocytes,Ua: NEGATIVE
Nitrite: NEGATIVE
Protein, ur: NEGATIVE mg/dL
Specific Gravity, Urine: 1.025 (ref 1.005–1.030)
pH: 6 (ref 5.0–8.0)

## 2021-04-08 LAB — LIPASE, BLOOD: Lipase: 31 U/L (ref 11–51)

## 2021-04-08 MED ORDER — SODIUM CHLORIDE 0.9 % IV BOLUS
1000.0000 mL | Freq: Once | INTRAVENOUS | Status: AC
  Administered 2021-04-08: 1000 mL via INTRAVENOUS

## 2021-04-08 MED ORDER — ONDANSETRON 4 MG PO TBDP: 4.0000 mg | ORAL_TABLET | Freq: Three times a day (TID) | ORAL | 0 refills | Status: AC | PRN

## 2021-04-08 MED ORDER — ONDANSETRON HCL 4 MG/2ML IJ SOLN
4.0000 mg | Freq: Once | INTRAMUSCULAR | Status: AC
Start: 2021-04-08 — End: 2021-04-08
  Administered 2021-04-08: 4 mg via INTRAVENOUS
  Filled 2021-04-08: qty 2

## 2021-04-08 NOTE — Discharge Instructions (Addendum)
Please pick up nausea medication and take as prescribed. Drink plenty of fluids to stay hydrated and eat bland diet to prevent worsening stomach issues.   Follow up with your PCP regarding ED visit today and for further eval  Return to the ED for any new/worsening symptoms including new abdominal pain, inability to tolerate liquids despite nausea medications, fevers > 100.4.

## 2021-04-08 NOTE — ED Notes (Addendum)
First contact with patient. Patient arrived via triage with complaints of n/v/d x 24 hours- pt recently tested positive for covid a week ago. Pt denies abdominal pain, no pain upon palpation. Pt is A&OX 4. Respirations even/unlabored. Patient changed into gown and placed on spo2/BP monitor. Patient updated on plan of care. Will continue to monitor patient. Patient placed in isolation precautions. Given BSC for urine collection. Blood collected, IV established. Call light in reach.   1542: Patient given ice water for PO challenge. Tolerated well.

## 2021-04-08 NOTE — ED Triage Notes (Signed)
Covid positive x 7 days , c/o n/v and weakness

## 2021-04-08 NOTE — ED Provider Notes (Signed)
[facility] EMERGENCY DEPARTMENT Provider Note   CSN: [id number] Arrival date & time: 04/08/21  1328     History Chief Complaint  Patient presents with   Covid Positive    Nicole [name] is a 60 y.o. female who presents to the ED today with complaint of nausea and NBNB emesis for the past 2-3 days. Pt reports 1 week ago she began having a sore throat and a dry cough. She took an at home COVID test and tested positive. She reports over the weekend she begun to improve and her sore throat/cough improved however later on in the weekend she began having nausea and emesis. She reports she was able to tolerate some water and crackers late last night however today she began vomiting again prompting ED visit. She denies any abdominal pain with same. She is having some mild diarrhea as well. Previous PSHx includes appendectomy and hysterectomy.   The history is provided by the patient and medical records.      Past Medical History:  Diagnosis Date   Chronic low back pain     Patient Active Problem List   Diagnosis Date Noted   Acute appendicitis 08/06/2018   Sacroiliac inflammation (HCC) 07/03/2018   Depressive disorder 07/01/2016   Radiculopathy, lumbar region 04/20/2016   Lumbar disc disorder 04/20/2016   Bilateral sacroiliitis (HCC) 04/20/2016   Insomnia 04/19/2016   GAD (generalized anxiety disorder) 04/19/2016    Past Surgical History:  Procedure Laterality Date   ABDOMINAL HYSTERECTOMY     LAPAROSCOPIC APPENDECTOMY N/A 08/06/2018   Procedure: APPENDECTOMY LAPAROSCOPIC;  Surgeon: Duanne Guess, MD;  Location: [facility] ORS;  Service: General;  Laterality: N/A;     OB History   No obstetric history on file.     No family history on file.  Social History   Tobacco Use   Smoking status: Never   Smokeless tobacco: Never  Substance Use Topics   Alcohol use: No    Alcohol/week: 0.0 standard drinks    Home Medications Prior to Admission medications    Medication Sig Start Date End Date Taking? Authorizing Provider  ondansetron (ZOFRAN ODT) 4 MG disintegrating tablet Take 1 tablet (4 mg total) by mouth every 8 (eight) hours as needed for nausea or vomiting. 04/08/21  Yes Haislee Corso, PA-C  cetirizine (ZYRTEC) 10 MG tablet Take 10 mg by mouth daily at 6 (six) AM. 01/03/17   [provider]  conjugated estrogens (PREMARIN) vaginal cream Place 0.625 mg vaginally 2 (two) times a week. 05/19/17   [provider]  cyclobenzaprine (FLEXERIL) 10 MG tablet Take 10 mg by mouth 3 (three) times daily. 05/22/18   [provider]  DULoxetine (CYMBALTA) 60 MG capsule Take 1 capsule (60 mg total) by mouth 2 (two) times daily. 12/22/20   Mozingo, Thereasa Solo, NP  fluticasone (FLONASE) 50 MCG/ACT nasal spray Place 2 sprays into both nostrils daily at 6 (six) AM. 11/02/16   [provider]  gabapentin (NEURONTIN) 100 MG capsule Take 100 mg by mouth at bedtime as needed. 03/29/17   [provider]  gabapentin (NEURONTIN) 300 MG capsule Take 300 mg by mouth 3 (three) times daily. 05/22/18   [provider]  methocarbamol (ROBAXIN) 500 MG tablet Take 1 tablet (500 mg total) by mouth 2 (two) times daily. 10/17/16   Law, Waylan Boga, PA-C  naproxen (NAPROSYN) 500 MG tablet Take 500 mg by mouth 2 (two) times daily. 07/03/18   [provider]  traMADol (ULTRAM) 50 MG  tablet Take 50 mg by mouth every 6 (six) hours as needed.    [provider]  zolpidem (AMBIEN) 10 MG tablet Take 1 tablet (10 mg total) by mouth at bedtime as needed for sleep. 01/26/21 04/26/21  Mozingo, Thereasa Solo, NP    Allergies    Ampicillin, Codeine, Hydrocodone-acetaminophen, Hydrocodone-acetaminophen, and Penicillins  Review of Systems   Review of Systems  Constitutional:  Negative for chills and fever.  Respiratory:  Negative for shortness of breath.   Cardiovascular:  Negative for chest pain.  Gastrointestinal:   Positive for diarrhea, nausea and vomiting. Negative for abdominal pain.  Genitourinary:  Negative for difficulty urinating and dysuria.  All other systems reviewed and are negative.  Physical Exam Updated Vital Signs BP (!) 132/114 (BP Location: Left Arm)   Pulse 98   Temp 98.8 F (37.1 C) (Oral)   Resp 18   Ht 5\' 8"  (1.727 m)   Wt 76.2 kg   SpO2 98%   BMI 25.54 kg/m   Physical Exam Vitals and nursing note reviewed.  Constitutional:      Appearance: She is not ill-appearing or diaphoretic.  HENT:     Head: Normocephalic and atraumatic.     Mouth/Throat:     Mouth: Mucous membranes are dry.  Eyes:     Conjunctiva/sclera: Conjunctivae normal.  Cardiovascular:     Rate and Rhythm: Normal rate and regular rhythm.     Pulses: Normal pulses.  Pulmonary:     Effort: Pulmonary effort is normal.     Breath sounds: Normal breath sounds. No wheezing, rhonchi or rales.  Abdominal:     Palpations: Abdomen is soft.     Tenderness: There is no abdominal tenderness. There is no guarding or rebound.  Musculoskeletal:     Cervical back: Neck supple.  Skin:    General: Skin is warm and dry.  Neurological:     Mental Status: She is alert.    ED Results / Procedures / Treatments   Labs (all labs ordered are listed, but only abnormal results are displayed) Labs Reviewed  COMPREHENSIVE METABOLIC PANEL - Abnormal; Notable for the following components:      Result Value   Glucose, Bld 110 (*)    Total Protein 8.2 (*)    All other components within normal limits  CBC WITH DIFFERENTIAL/PLATELET - Abnormal; Notable for the following components:   RBC 5.89 (*)    Hemoglobin 18.5 (*)    HCT 52.6 (*)    All other components within normal limits  URINALYSIS, ROUTINE W REFLEX MICROSCOPIC - Abnormal; Notable for the following components:   Ketones, ur 40 (*)    All other components within normal limits  LIPASE, BLOOD    EKG None  Radiology No results  found.  Procedures Procedures   Medications Ordered in ED Medications  sodium chloride 0.9 % bolus 1,000 mL (0 mLs Intravenous Stopped 04/08/21 1550)  ondansetron (ZOFRAN) injection 4 mg (4 mg Intravenous Given 04/08/21 1447)    ED Course  I have reviewed the triage vital signs and the nursing notes.  Pertinent labs & imaging results that were available during my care of the patient were reviewed by me and considered in my medical decision making (see chart for details).    MDM Rules/Calculators/A&P                           60 year old female who presents to the ED today with complaint  of nausea and vomiting for the past 2 to 3 days with recent COVID infection 1 week ago.  On arrival to the ED today patient is afebrile 98.8.  She has nontachycardic, nontachypneic, appears to be in no acute distress.  She does have dry mucous membranes on exam today.  She is not actively vomiting.  Her abdomen is soft and nontender.  Do suspect her symptoms are likely related to COVID. Very low suspicion for acute abdomen. We will plan for labs, fluids, antiemetics and reevaluation.  Given she is greater than 5 days out from her COVID infection she does not qualify for antivirals at this time.  I, Deola Rewis [employer], personally reviewed and evaluated these images and lab results as part of my medical decision-making.  CBC without leukocytosis. Hgb, HCT, and RBC slightly elevated consistent with dehydration.  CMP with glucose 110. No other electrolyte abnormalities. LFTs unremarkable.  Lipase 31.  U/A without signs of infection. Ketones present; receiving fluids.   On reevaluation pt resting comfortably. Reports significant improvement in symptoms. She has been able to tolerate fluids without difficulty. Will discharge home with zofran. Pt instructed to drink plenty of fluids to stay hydrated and to eat BLAND diet. PCP follow up recommended as well. Pt in agreement with plan and stable for discharge home.    This note was prepared using Dragon voice recognition software and may include unintentional dictation errors due to the inherent limitations of voice recognition software.  Nicole [name] was evaluated in Emergency Department on 04/08/2021 for the symptoms described in the history of present illness. She was evaluated in the context of the global COVID-19 pandemic, which necessitated consideration that the patient might be at risk for infection with the SARS-CoV-2 virus that causes COVID-19. Institutional protocols and algorithms that pertain to the evaluation of patients at risk for COVID-19 are in a state of rapid change based on information released by regulatory bodies including the CDC and federal and state organizations. These policies and algorithms were followed during the patient's care in the ED.   Final Clinical Impression(s) / ED Diagnoses Final diagnoses:  COVID-19  Nausea vomiting and diarrhea    Rx / DC Orders ED Discharge Orders          Ordered    ondansetron (ZOFRAN ODT) 4 MG disintegrating tablet  Every 8 hours PRN        04/08/21 1558             Discharge Instructions      Please pick up nausea medication and take as prescribed. Drink plenty of fluids to stay hydrated and eat bland diet to prevent worsening stomach issues.   Follow up with your PCP regarding ED visit today and for further eval  Return to the ED for any new/worsening symptoms including new abdominal pain, inability to tolerate liquids despite nausea medications, fevers > 100.4.        Tanda Rockers, PA-C 04/08/21 1600    Gloris Manchester, MD 04/09/21 1901

## 2021-04-09 ENCOUNTER — Telehealth: Payer: Self-pay | Admitting: Adult Health

## 2021-04-09 ENCOUNTER — Other Ambulatory Visit: Payer: Self-pay | Admitting: Adult Health

## 2021-04-09 DIAGNOSIS — F411 Generalized anxiety disorder: Secondary | ICD-10-CM

## 2021-04-09 MED ORDER — DULOXETINE HCL 30 MG PO CPEP: 30.0000 mg | ORAL_CAPSULE | Freq: Every day | ORAL | 2 refills | Status: DC

## 2021-04-09 NOTE — Telephone Encounter (Signed)
Please review

## 2021-04-09 NOTE — Telephone Encounter (Signed)
Nicole [name] called and said that she had Covid all last week and because she felt so bad she did not take her Cymbalta. She is now having nausea, vomiting, sweating and hot and cold chills. She wants to know what to do to get back on it safely? Her phone number is [phone].

## 2021-04-09 NOTE — Telephone Encounter (Signed)
Pt informed

## 2021-04-09 NOTE — Telephone Encounter (Signed)
I sent in a script for the 30mg  - take 30mg  x 5 days, then 60mg  x 5 days, then 90 mg x 5 days, then increase to 120mg  - previous dose.

## 2021-05-11 ENCOUNTER — Encounter: Payer: Self-pay | Admitting: General Surgery

## 2021-08-26 ENCOUNTER — Other Ambulatory Visit: Payer: Self-pay | Admitting: Adult Health

## 2021-08-26 DIAGNOSIS — F411 Generalized anxiety disorder: Secondary | ICD-10-CM

## 2021-08-26 NOTE — Telephone Encounter (Signed)
Please schedule appt

## 2021-08-28 NOTE — Telephone Encounter (Signed)
Appt scheduled 1/31.

## 2021-09-01 ENCOUNTER — Ambulatory Visit: Payer: BC Managed Care – PPO | Admitting: Adult Health

## 2021-09-17 ENCOUNTER — Ambulatory Visit: Payer: BC Managed Care – PPO | Admitting: Adult Health

## 2021-09-18 ENCOUNTER — Encounter: Payer: Self-pay | Admitting: Adult Health

## 2021-09-18 ENCOUNTER — Other Ambulatory Visit: Payer: Self-pay

## 2021-09-18 ENCOUNTER — Ambulatory Visit: Payer: BC Managed Care – PPO | Admitting: Adult Health

## 2021-09-18 DIAGNOSIS — F411 Generalized anxiety disorder: Secondary | ICD-10-CM | POA: Diagnosis not present

## 2021-09-18 DIAGNOSIS — F331 Major depressive disorder, recurrent, moderate: Secondary | ICD-10-CM

## 2021-09-18 DIAGNOSIS — F410 Panic disorder [episodic paroxysmal anxiety] without agoraphobia: Secondary | ICD-10-CM

## 2021-09-18 DIAGNOSIS — G4700 Insomnia, unspecified: Secondary | ICD-10-CM | POA: Diagnosis not present

## 2021-09-18 MED ORDER — ZOLPIDEM TARTRATE 10 MG PO TABS: 10.0000 mg | ORAL_TABLET | Freq: Every evening | ORAL | 0 refills | Status: DC | PRN

## 2021-09-18 MED ORDER — DULOXETINE HCL 60 MG PO CPEP: 60.0000 mg | ORAL_CAPSULE | Freq: Two times a day (BID) | ORAL | 1 refills | Status: DC

## 2021-09-18 NOTE — Progress Notes (Signed)
ANNASTASIA [name] [id number] 01/29/1961 61 y.o.  Subjective:   Patient ID:  Nicole [name] is a 61 y.o. (DOB 06/16/1961) female.  Chief Complaint: No chief complaint on file.   HPI MORELIA CASSELLS presents to the office today for follow-up of MDD, GAD, panic attacks and insomnia.  Describes mood today as "ok". Pleasant. Denies tearfulness. Mood symptoms - denies depression, anxiety, and irritability. Denies panic attacks. Stating "I'm doing well". Feels like medications continue to work well for her. Stable interest and motivation. Taking medications as prescribed.  Energy levels stable. Active, has a regular exercise routine.   Is able to enjoy usual interests and activities. Single. Lives alone. Involved in church. Family in [place]. Appetite improved. Weight loss - 172 to 175 pounds. Sleep has improved. Averages 6 hours of sleep.   Focus and concentration improved. Completing tasks. Managing aspects of household. Works full-time - Clinical cytogeneticist. Denies SI or HI.  Denies AH or VH.  Previous medication trials: Denies.    Flowsheet Row ED from 04/08/2021 in [facility] EMERGENCY DEPARTMENT  C-SSRS RISK CATEGORY No Risk        Review of Systems:  Review of Systems  Musculoskeletal:  Negative for gait problem.  Neurological:  Negative for tremors.  Psychiatric/Behavioral:         Please refer to HPI   Medications: I have reviewed the patient's current medications.  Current Outpatient Medications  Medication Sig Dispense Refill   cetirizine (ZYRTEC) 10 MG tablet Take 10 mg by mouth daily at 6 (six) AM.     conjugated estrogens (PREMARIN) vaginal cream Place 0.625 mg vaginally 2 (two) times a week.     cyclobenzaprine (FLEXERIL) 10 MG tablet Take 10 mg by mouth 3 (three) times daily.     DULoxetine (CYMBALTA) 30 MG capsule TAKE 1 CAPSULE BY MOUTH EVERY DAY 90 capsule 0   DULoxetine (CYMBALTA) 60 MG capsule Take 1 capsule (60 mg total) by mouth 2 (two) times daily.  180 capsule 1   fluticasone (FLONASE) 50 MCG/ACT nasal spray Place 2 sprays into both nostrils daily at 6 (six) AM.     gabapentin (NEURONTIN) 100 MG capsule Take 100 mg by mouth at bedtime as needed.     gabapentin (NEURONTIN) 300 MG capsule Take 300 mg by mouth 3 (three) times daily.     methocarbamol (ROBAXIN) 500 MG tablet Take 1 tablet (500 mg total) by mouth 2 (two) times daily. 20 tablet 0   naproxen (NAPROSYN) 500 MG tablet Take 500 mg by mouth 2 (two) times daily.     ondansetron (ZOFRAN ODT) 4 MG disintegrating tablet Take 1 tablet (4 mg total) by mouth every 8 (eight) hours as needed for nausea or vomiting. 20 tablet 0   traMADol (ULTRAM) 50 MG tablet Take 50 mg by mouth every 6 (six) hours as needed.     zolpidem (AMBIEN) 10 MG tablet Take 1 tablet (10 mg total) by mouth at bedtime as needed for sleep. 90 tablet 0   No current facility-administered medications for this visit.    Medication Side Effects: None  Allergies:  Allergies  Allergen Reactions   Ampicillin Anaphylaxis   Codeine Nausea And Vomiting    hallucinations hallucinations hallucinations    Hydrocodone-Acetaminophen Nausea And Vomiting   Hydrocodone-Acetaminophen Nausea And Vomiting   Penicillins Anaphylaxis and Hives    Past Medical History:  Diagnosis Date   Chronic low back pain     Past Medical History, Surgical history, Social history,  and Family history were reviewed and updated as appropriate.   Please see review of systems for further details on the patient's review from today.   Objective:   Physical Exam:  There were no vitals taken for this visit.  Physical Exam Constitutional:      General: She is not in acute distress. Musculoskeletal:        General: No deformity.  Neurological:     Mental Status: She is alert and oriented to person, place, and time.     Coordination: Coordination normal.  Psychiatric:        Attention and Perception: Attention and perception normal. She does  not perceive auditory or visual hallucinations.        Mood and Affect: Mood normal. Mood is not anxious or depressed. Affect is not labile, blunt, angry or inappropriate.        Speech: Speech normal.        Behavior: Behavior normal.        Thought Content: Thought content normal. Thought content is not paranoid or delusional. Thought content does not include homicidal or suicidal ideation. Thought content does not include homicidal or suicidal plan.        Cognition and Memory: Cognition and memory normal.        Judgment: Judgment normal.     Comments: Insight intact    Lab Review:     Component Value Date/Time   NA 141 04/08/2021 1440   K 4.1 04/08/2021 1440   CL 102 04/08/2021 1440   CO2 30 04/08/2021 1440   GLUCOSE 110 (H) 04/08/2021 1440   BUN 16 04/08/2021 1440   CREATININE 0.99 04/08/2021 1440   CALCIUM 10.1 04/08/2021 1440   PROT 8.2 (H) 04/08/2021 1440   ALBUMIN 4.9 04/08/2021 1440   AST 22 04/08/2021 1440   ALT 31 04/08/2021 1440   ALKPHOS 88 04/08/2021 1440   BILITOT 0.7 04/08/2021 1440   GFRNONAA >60 04/08/2021 1440   GFRAA >60 08/06/2018 0815       Component Value Date/Time   WBC 9.6 04/08/2021 1440   RBC 5.89 (H) 04/08/2021 1440   HGB 18.5 (H) 04/08/2021 1440   HCT 52.6 (H) 04/08/2021 1440   PLT 276 04/08/2021 1440   MCV 89.3 04/08/2021 1440   MCH 31.4 04/08/2021 1440   MCHC 35.2 04/08/2021 1440   RDW 11.9 04/08/2021 1440   LYMPHSABS 3.1 04/08/2021 1440   MONOABS 0.5 04/08/2021 1440   EOSABS 0.1 04/08/2021 1440   BASOSABS 0.0 04/08/2021 1440    No results found for: POCLITH, LITHIUM   No results found for: PHENYTOIN, PHENOBARB, VALPROATE, CBMZ   .res Assessment: Plan:    Plan:  PDMP reviewed  1. Cymbalta 60mg  twice daily 2. Zolpidem 10mg  at hs - sleep  Patient advised to contact office with any questions, adverse effects, or acute worsening in signs and symptoms.  RTC 6 months Diagnoses and all orders for this visit:  Panic  attacks  Major depressive disorder, recurrent episode, moderate (HCC) -     DULoxetine (CYMBALTA) 60 MG capsule; Take 1 capsule (60 mg total) by mouth 2 (two) times daily.  Generalized anxiety disorder -     DULoxetine (CYMBALTA) 60 MG capsule; Take 1 capsule (60 mg total) by mouth 2 (two) times daily.  Insomnia, unspecified type -     zolpidem (AMBIEN) 10 MG tablet; Take 1 tablet (10 mg total) by mouth at bedtime as needed for sleep.     Please see After  Visit Summary for patient specific instructions.  Future Appointments  Date Time Provider Department Center  03/18/2022  8:00 AM Barbra Miner, Thereasa Solo, NP [facility] None    No orders of the defined types were placed in this encounter.   -------------------------------

## 2021-11-29 ENCOUNTER — Other Ambulatory Visit: Payer: Self-pay | Admitting: Adult Health

## 2021-11-29 DIAGNOSIS — G4700 Insomnia, unspecified: Secondary | ICD-10-CM

## 2021-11-29 NOTE — Telephone Encounter (Signed)
Last filled 2/17, due 5/16  ?

## 2022-03-18 ENCOUNTER — Ambulatory Visit: Payer: BC Managed Care – PPO | Admitting: Adult Health

## 2022-03-31 ENCOUNTER — Encounter: Payer: Self-pay | Admitting: Adult Health

## 2022-03-31 ENCOUNTER — Ambulatory Visit (INDEPENDENT_AMBULATORY_CARE_PROVIDER_SITE_OTHER): Payer: BC Managed Care – PPO | Admitting: Adult Health

## 2022-03-31 DIAGNOSIS — F410 Panic disorder [episodic paroxysmal anxiety] without agoraphobia: Secondary | ICD-10-CM | POA: Diagnosis not present

## 2022-03-31 DIAGNOSIS — G4700 Insomnia, unspecified: Secondary | ICD-10-CM

## 2022-03-31 DIAGNOSIS — F411 Generalized anxiety disorder: Secondary | ICD-10-CM

## 2022-03-31 DIAGNOSIS — F331 Major depressive disorder, recurrent, moderate: Secondary | ICD-10-CM | POA: Diagnosis not present

## 2022-03-31 MED ORDER — DULOXETINE HCL 60 MG PO CPEP: 60.0000 mg | ORAL_CAPSULE | Freq: Two times a day (BID) | ORAL | 3 refills | Status: DC

## 2022-03-31 MED ORDER — ZOLPIDEM TARTRATE 10 MG PO TABS: 10.0000 mg | ORAL_TABLET | Freq: Every evening | ORAL | 0 refills | Status: DC | PRN

## 2022-03-31 NOTE — Progress Notes (Signed)
Nicole [name] [id number] [birth year] 61 y.o.  Subjective:   Patient ID:  Nicole [name] is a 61 y.o. (DOB 1960/10/14) female.  Chief Complaint: No chief complaint on file.   HPI Nicole [name] presents to the office today for follow-up of MDD, GAD, panic attacks and insomnia.  Describes mood today as "ok". Pleasant. Denies tearfulness. Mood symptoms - denies depression. Reports some  anxiety and irritability. Denies panic attacks. Mood is consistent. Stating "I'm in a happy place". Feels like medications continue to work well. Stable interest and motivation. Taking medications as prescribed.  Energy levels lower. Active, has a regular exercise routine.   Is able to enjoy usual interests and activities. Single. Lives alone. Involved in church. Family in [place]. Appetite improved. Weight gain - 175 to 188 pounds. Sleep varies - "up and down". Averages 6 hours of sleep with Ambien.   Focus and concentration improved. Completing tasks. Managing aspects of household. Works full-time - Clinical cytogeneticist. Denies SI or HI.  Denies AH or VH.  Previous medication trials: Denies.     Flowsheet Row ED from 04/08/2021 in [facility] EM[facility]ENCY DEPARTMENT  C-SSRS RISK CATEGORY No Risk        Review of Systems:  Review of Systems  Musculoskeletal:  Negative for gait problem.  Neurological:  Negative for tremors.  Psychiatric/Behavioral:         Please refer to HPI    Medications: I have reviewed the patient's current medications.  Current Outpatient Medications  Medication Sig Dispense Refill   cetirizine (ZYRTEC) 10 MG tablet Take 10 mg by mouth daily at 6 (six) AM.     conjugated estrogens (PREMARIN) vaginal cream Place 0.625 mg vaginally 2 (two) times a week.     cyclobenzaprine (FLEXERIL) 10 MG tablet Take 10 mg by mouth 3 (three) times daily.     DULoxetine (CYMBALTA) 60 MG capsule Take 1 capsule (60 mg total) by mouth 2 (two) times daily. 180 capsule 1   fluticasone  (FLONASE) 50 MCG/ACT nasal spray Place 2 sprays into both nostrils daily at 6 (six) AM.     gabapentin (NEURONTIN) 100 MG capsule Take 100 mg by mouth at bedtime as needed.     gabapentin (NEURONTIN) 300 MG capsule Take 300 mg by mouth 3 (three) times daily.     methocarbamol (ROBAXIN) 500 MG tablet Take 1 tablet (500 mg total) by mouth 2 (two) times daily. 20 tablet 0   naproxen (NAPROSYN) 500 MG tablet Take 500 mg by mouth 2 (two) times daily.     ondansetron (ZOFRAN ODT) 4 MG disintegrating tablet Take 1 tablet (4 mg total) by mouth every 8 (eight) hours as needed for nausea or vomiting. 20 tablet 0   traMADol (ULTRAM) 50 MG tablet Take 50 mg by mouth every 6 (six) hours as needed.     zolpidem (AMBIEN) 10 MG tablet TAKE 1 TABLET BY MOUTH AT BEDTIME AS NEEDED FOR SLEEP. 90 tablet 0   No current facility-administered medications for this visit.    Medication Side Effects: None  Allergies:  Allergies  Allergen Reactions   Ampicillin Anaphylaxis   Codeine Nausea And Vomiting    hallucinations hallucinations hallucinations    Hydrocodone-Acetaminophen Nausea And Vomiting   Hydrocodone-Acetaminophen Nausea And Vomiting   Penicillins Anaphylaxis and Hives    Past Medical History:  Diagnosis Date   Chronic low back pain     Past Medical History, Surgical history, Social history, and Family history were reviewed and updated  as appropriate.   Please see review of systems for further details on the patient's review from today.   Objective:   Physical Exam:  There were no vitals taken for this visit.  Physical Exam Constitutional:      General: She is not in acute distress. Musculoskeletal:        General: No deformity.  Neurological:     Mental Status: She is alert and oriented to person, place, and time.     Coordination: Coordination normal.  Psychiatric:        Attention and Perception: Attention and perception normal. She does not perceive auditory or visual  hallucinations.        Mood and Affect: Mood normal. Mood is not anxious or depressed. Affect is not labile, blunt, angry or inappropriate.        Speech: Speech normal.        Behavior: Behavior normal.        Thought Content: Thought content normal. Thought content is not paranoid or delusional. Thought content does not include homicidal or suicidal ideation. Thought content does not include homicidal or suicidal plan.        Cognition and Memory: Cognition and memory normal.        Judgment: Judgment normal.     Comments: Insight intact     Lab Review:     Component Value Date/Time   NA 141 04/08/2021 1440   K 4.1 04/08/2021 1440   CL 102 04/08/2021 1440   CO2 30 04/08/2021 1440   GLUCOSE 110 (H) 04/08/2021 1440   BUN 16 04/08/2021 1440   CREATININE 0.99 04/08/2021 1440   CALCIUM 10.1 04/08/2021 1440   PROT 8.2 (H) 04/08/2021 1440   ALBUMIN 4.9 04/08/2021 1440   AST 22 04/08/2021 1440   ALT 31 04/08/2021 1440   ALKPHOS 88 04/08/2021 1440   BILITOT 0.7 04/08/2021 1440   GFRNONAA >60 04/08/2021 1440   GFRAA >60 08/06/2018 0815       Component Value Date/Time   WBC 9.6 04/08/2021 1440   RBC 5.89 (H) 04/08/2021 1440   HGB 18.5 (H) 04/08/2021 1440   HCT 52.6 (H) 04/08/2021 1440   PLT 276 04/08/2021 1440   MCV 89.3 04/08/2021 1440   MCH 31.4 04/08/2021 1440   MCHC 35.2 04/08/2021 1440   RDW 11.9 04/08/2021 1440   LYMPHSABS 3.1 04/08/2021 1440   MONOABS 0.5 04/08/2021 1440   EOSABS 0.1 04/08/2021 1440   BASOSABS 0.0 04/08/2021 1440    No results found for: "POCLITH", "LITHIUM"   No results found for: "PHENYTOIN", "PHENOBARB", "VALPROATE", "CBMZ"   .res Assessment: Plan:    Plan:  PDMP reviewed  1. Cymbalta 60mg  twice daily 2. Zolpidem 10mg  at hs - sleep  Patient advised to contact office with any questions, adverse effects, or acute worsening in signs and symptoms.  RTC 6 months  Diagnoses and all orders for this visit:  Panic attacks  Major  depressive disorder, recurrent episode, moderate (HCC)  Generalized anxiety disorder  Insomnia, unspecified type     Please see After Visit Summary for patient specific instructions.  No future appointments.   No orders of the defined types were placed in this encounter.   -------------------------------

## 2022-09-29 ENCOUNTER — Ambulatory Visit (INDEPENDENT_AMBULATORY_CARE_PROVIDER_SITE_OTHER): Payer: BLUE CROSS/BLUE SHIELD | Admitting: Adult Health

## 2022-09-29 ENCOUNTER — Encounter: Payer: Self-pay | Admitting: Adult Health

## 2022-09-29 DIAGNOSIS — G4700 Insomnia, unspecified: Secondary | ICD-10-CM | POA: Diagnosis not present

## 2022-09-29 DIAGNOSIS — F331 Major depressive disorder, recurrent, moderate: Secondary | ICD-10-CM | POA: Diagnosis not present

## 2022-09-29 DIAGNOSIS — F411 Generalized anxiety disorder: Secondary | ICD-10-CM

## 2022-09-29 MED ORDER — DULOXETINE HCL 60 MG PO CPEP: 60.0000 mg | ORAL_CAPSULE | Freq: Two times a day (BID) | ORAL | 3 refills | Status: AC

## 2022-09-29 MED ORDER — ZOLPIDEM TARTRATE 10 MG PO TABS: 10.0000 mg | ORAL_TABLET | Freq: Every evening | ORAL | 0 refills | Status: DC | PRN

## 2022-09-29 NOTE — Progress Notes (Signed)
Nicole [name] [id number] 20-Jan-1961 62 y.o.  Subjective:   Patient ID:  Nicole [name] is a 62 y.o. (DOB 1961/06/01) female.  Chief Complaint: No chief complaint on file.   HPI Nicole [name] presents to the office today for follow-up of MDD, insomnia, and GAD.  Describes mood today as "ok". Pleasant. Denies tearfulness. Mood symptoms - denies depression, anxiety and irritability. Denies panic attacks. Mood is consistent. Stating "I'm doing pretty good". Feels like medications continue to work well. Stable interest and motivation. Taking medications as prescribed.  Energy levels stable. Active, has a regular exercise routine.   Is able to enjoy usual interests and activities. Single. Lives alone. Involved in church. Family in [place]. Appetite improved. Weight fluctuates - 180 to 190 pounds. Sleep varies Averages 6 hours of sleep with Ambien.   Focus and concentration improved. Completing tasks. Managing aspects of household. Works full-time. Denies SI or HI.  Denies AH or VH. Denies self harm. Denies substance use.  Previous medication trials: Denies.    [place] [facility] from 04/08/2021 in [facility] Emergency Department at [place] No Risk        Review of Systems:  Review of Systems  Musculoskeletal:  Negative for gait problem.  Neurological:  Negative for tremors.  Psychiatric/Behavioral:         Please refer to HPI    Medications: I have reviewed the patient's current medications.  Current Outpatient Medications  Medication Sig Dispense Refill   cetirizine (ZYRTEC) 10 MG tablet Take 10 mg by mouth daily at 6 (six) AM.     conjugated estrogens (PREMARIN) vaginal cream Place 0.625 mg vaginally 2 (two) times a week.     cyclobenzaprine (FLEXERIL) 10 MG tablet Take 10 mg by mouth 3 (three) times daily.     DULoxetine (CYMBALTA) 60 MG capsule Take 1 capsule (60 mg total) by mouth 2 (two) times daily. 90 capsule 3   fluticasone  (FLONASE) 50 MCG/ACT nasal spray Place 2 sprays into both nostrils daily at 6 (six) AM.     gabapentin (NEURONTIN) 100 MG capsule Take 100 mg by mouth at bedtime as needed.     gabapentin (NEURONTIN) 300 MG capsule Take 300 mg by mouth 3 (three) times daily.     methocarbamol (ROBAXIN) 500 MG tablet Take 1 tablet (500 mg total) by mouth 2 (two) times daily. 20 tablet 0   naproxen (NAPROSYN) 500 MG tablet Take 500 mg by mouth 2 (two) times daily.     ondansetron (ZOFRAN ODT) 4 MG disintegrating tablet Take 1 tablet (4 mg total) by mouth every 8 (eight) hours as needed for nausea or vomiting. 20 tablet 0   traMADol (ULTRAM) 50 MG tablet Take 50 mg by mouth every 6 (six) hours as needed.     zolpidem (AMBIEN) 10 MG tablet Take 1 tablet (10 mg total) by mouth at bedtime as needed. for sleep 90 tablet 0   No current facility-administered medications for this visit.    Medication Side Effects: None  Allergies:  Allergies  Allergen Reactions   Ampicillin Anaphylaxis   Codeine Nausea And Vomiting    hallucinations hallucinations hallucinations    Hydrocodone-Acetaminophen Nausea And Vomiting   Hydrocodone-Acetaminophen Nausea And Vomiting   Penicillins Anaphylaxis and Hives    Past Medical History:  Diagnosis Date   Chronic low back pain     Past Medical History, Surgical history, Social history, and Family history were reviewed and updated as appropriate.  Please see review of systems for further details on the patient's review from today.   Objective:   Physical Exam:  There were no vitals taken for this visit.  Physical Exam Constitutional:      General: She is not in acute distress. Musculoskeletal:        General: No deformity.  Neurological:     Mental Status: She is alert and oriented to person, place, and time.     Coordination: Coordination normal.  Psychiatric:        Attention and Perception: Attention and perception normal. She does not perceive auditory or  visual hallucinations.        Mood and Affect: Mood normal. Mood is not anxious or depressed. Affect is not labile, blunt, angry or inappropriate.        Speech: Speech normal.        Behavior: Behavior normal.        Thought Content: Thought content normal. Thought content is not paranoid or delusional. Thought content does not include homicidal or suicidal ideation. Thought content does not include homicidal or suicidal plan.        Cognition and Memory: Cognition and memory normal.        Judgment: Judgment normal.     Comments: Insight intact     Lab Review:     Component Value Date/Time   NA 141 04/08/2021 1440   K 4.1 04/08/2021 1440   CL 102 04/08/2021 1440   CO2 30 04/08/2021 1440   GLUCOSE 110 (H) 04/08/2021 1440   BUN 16 04/08/2021 1440   CREATININE 0.99 04/08/2021 1440   CALCIUM 10.1 04/08/2021 1440   PROT 8.2 (H) 04/08/2021 1440   ALBUMIN 4.9 04/08/2021 1440   AST 22 04/08/2021 1440   ALT 31 04/08/2021 1440   ALKPHOS 88 04/08/2021 1440   BILITOT 0.7 04/08/2021 1440   GFRNONAA >60 04/08/2021 1440   GFRAA >60 08/06/2018 0815       Component Value Date/Time   WBC 9.6 04/08/2021 1440   RBC 5.89 (H) 04/08/2021 1440   HGB 18.5 (H) 04/08/2021 1440   HCT 52.6 (H) 04/08/2021 1440   PLT 276 04/08/2021 1440   MCV 89.3 04/08/2021 1440   MCH 31.4 04/08/2021 1440   MCHC 35.2 04/08/2021 1440   RDW 11.9 04/08/2021 1440   LYMPHSABS 3.1 04/08/2021 1440   MONOABS 0.5 04/08/2021 1440   EOSABS 0.1 04/08/2021 1440   BASOSABS 0.0 04/08/2021 1440    No results found for: "POCLITH", "LITHIUM"   No results found for: "PHENYTOIN", "PHENOBARB", "VALPROATE", "CBMZ"   .res Assessment: Plan:    Plan:  PDMP reviewed  1. Cymbalta '60mg'$  daily 2. Zolpidem '10mg'$  at hs - sleep - will call in 3 months or when needs next   Patient advised to contact office with any questions, adverse effects, or acute worsening in signs and symptoms.  RTC 1 year  Diagnoses and all orders for  this visit:  Insomnia, unspecified type -     zolpidem (AMBIEN) 10 MG tablet; Take 1 tablet (10 mg total) by mouth at bedtime as needed. for sleep  Major depressive disorder, recurrent episode, moderate (HCC) -     DULoxetine (CYMBALTA) 60 MG capsule; Take 1 capsule (60 mg total) by mouth 2 (two) times daily.  Generalized anxiety disorder -     DULoxetine (CYMBALTA) 60 MG capsule; Take 1 capsule (60 mg total) by mouth 2 (two) times daily.     Please see After Visit Summary  for patient specific instructions.  No future appointments.   No orders of the defined types were placed in this encounter.   -------------------------------

## 2022-11-05 ENCOUNTER — Other Ambulatory Visit: Payer: Self-pay | Admitting: Adult Health

## 2022-11-05 DIAGNOSIS — G4700 Insomnia, unspecified: Secondary | ICD-10-CM

## 2022-11-05 NOTE — Telephone Encounter (Signed)
Pt lvm that she was never able to get ambien at [facility]. They told her that they lost the script. So she wants a new one [facility] [place].

## 2023-02-25 ENCOUNTER — Ambulatory Visit: Payer: BLUE CROSS/BLUE SHIELD | Admitting: Adult Health

## 2023-03-03 ENCOUNTER — Encounter: Payer: Self-pay | Admitting: Adult Health

## 2023-03-03 ENCOUNTER — Ambulatory Visit (INDEPENDENT_AMBULATORY_CARE_PROVIDER_SITE_OTHER): Payer: 59 | Admitting: Adult Health

## 2023-03-03 DIAGNOSIS — F411 Generalized anxiety disorder: Secondary | ICD-10-CM | POA: Diagnosis not present

## 2023-03-03 DIAGNOSIS — G4700 Insomnia, unspecified: Secondary | ICD-10-CM | POA: Diagnosis not present

## 2023-03-03 DIAGNOSIS — F331 Major depressive disorder, recurrent, moderate: Secondary | ICD-10-CM

## 2023-03-03 DIAGNOSIS — F410 Panic disorder [episodic paroxysmal anxiety] without agoraphobia: Secondary | ICD-10-CM | POA: Diagnosis not present

## 2023-03-03 MED ORDER — ZOLPIDEM TARTRATE 10 MG PO TABS: 10.0000 mg | ORAL_TABLET | Freq: Every evening | ORAL | 2 refills | Status: AC | PRN

## 2023-03-03 NOTE — Progress Notes (Signed)
Nicole [name] [id number] July 15, 1961 62 y.o.  Subjective:   Patient ID:  Nicole [name] is a 62 y.o. (DOB May 05, 1961) female.  Chief Complaint: No chief complaint on file.   HPI Nicole [name] presents to the office today for follow-up of MDD, panic attacks, insomnia, and GAD.  Describes mood today as "ok". Pleasant. Denies tearfulness. Mood symptoms - reports increased depression, anxiety and irritability. Reports panic attacks. Reports increased worry, rumination, and over thinking. Reports she feels overwhelmed. Mood is lower. Stating "I feel like I'm falling apart". 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 Has not been caring for herself or home. Mother planning to come and help her. Stating "I just have a lot going on and I'm struggling right now". Feels like medications are helpful. Stable interest and motivation. Taking medications as prescribed.  Energy levels stable. Active, has a regular exercise routine.   Is able to enjoy usual interests and activities. Single. Lives alone. Involved in church. Family in [place]. Appetite improved. Weight fluctuates - 180 to 190 pounds. Reports sleeping difficulties. Averages 4 to 6 hours of sleep with Ambien.   Focus and concentration improved. Completing tasks. Managing aspects of household. Works full-time. Denies SI or HI.  Denies AH or VH. Denies self harm. Denies substance use.  Previous medication trials: Denies.    Flowsheet Row ED from 04/08/2021 in [facility] Emergency Department at [facility]  C-SSRS RISK CATEGORY No Risk        Review of Systems:  Review of Systems  Musculoskeletal:  Negative for gait problem.  Neurological:  Negative for tremors.  Psychiatric/Behavioral:         Please refer to HPI    Medications: I have reviewed the patient's current medications.  Current Outpatient Medications  Medication Sig Dispense  Refill   cetirizine (ZYRTEC) 10 MG tablet Take 10 mg by mouth daily at 6 (six) AM.     conjugated estrogens (PREMARIN) vaginal cream Place 0.625 mg vaginally 2 (two) times a week.     cyclobenzaprine (FLEXERIL) 10 MG tablet Take 10 mg by mouth 3 (three) times daily.     DULoxetine (CYMBALTA) 60 MG capsule Take 1 capsule (60 mg total) by mouth 2 (two) times daily. 90 capsule 3   fluticasone (FLONASE) 50 MCG/ACT nasal spray Place 2 sprays into both nostrils daily at 6 (six) AM.     gabapentin (NEURONTIN) 100 MG capsule Take 100 mg by mouth at bedtime as needed.     gabapentin (NEURONTIN) 300 MG capsule Take 300 mg by mouth 3 (three) times daily.     methocarbamol (ROBAXIN) 500 MG tablet Take 1 tablet (500 mg total) by mouth 2 (two) times daily. 20 tablet 0   naproxen (NAPROSYN) 500 MG tablet Take 500 mg by mouth 2 (two) times daily.     ondansetron (ZOFRAN ODT) 4 MG disintegrating tablet Take 1 tablet (4 mg total) by mouth every 8 (eight) hours as needed for nausea or vomiting. 20 tablet 0   traMADol (ULTRAM) 50 MG tablet Take 50 mg by mouth every 6 (six) hours as needed.     zolpidem (AMBIEN) 10 MG tablet TAKE 1 TABLET (10 MG TOTAL) BY MOUTH AT BEDTIME AS NEEDED. FOR SLEEP 90 tablet 2   No current facility-administered medications for this visit.    Medication Side Effects: None  Allergies:  Allergies  Allergen Reactions   Ampicillin Anaphylaxis  Codeine Nausea And Vomiting    hallucinations hallucinations hallucinations    Hydrocodone-Acetaminophen Nausea And Vomiting   Hydrocodone-Acetaminophen Nausea And Vomiting   Penicillins Anaphylaxis and Hives    Past Medical History:  Diagnosis Date   Chronic low back pain     Past Medical History, Surgical history, Social history, and Family history were reviewed and updated as appropriate.   Please see review of systems for further details on the patient's review from today.   Objective:   Physical Exam:  There were no vitals  taken for this visit.  Physical Exam Constitutional:      General: She is not in acute distress. Musculoskeletal:        General: No deformity.  Neurological:     Mental Status: She is alert and oriented to person, place, and time.     Coordination: Coordination normal.  Psychiatric:        Attention and Perception: Attention and perception normal. She does not perceive auditory or visual hallucinations.        Mood and Affect: Mood normal. Mood is not anxious or depressed. Affect is not labile, blunt, angry or inappropriate.        Speech: Speech normal.        Behavior: Behavior normal.        Thought Content: Thought content normal. Thought content is not paranoid or delusional. Thought content does not include homicidal or suicidal ideation. Thought content does not include homicidal or suicidal plan.        Cognition and Memory: Cognition and memory normal.        Judgment: Judgment normal.     Comments: Insight intact     Lab Review:     Component Value Date/Time   NA 141 04/08/2021 1440   K 4.1 04/08/2021 1440   CL 102 04/08/2021 1440   CO2 30 04/08/2021 1440   GLUCOSE 110 (H) 04/08/2021 1440   BUN 16 04/08/2021 1440   CREATININE 0.99 04/08/2021 1440   CALCIUM 10.1 04/08/2021 1440   PROT 8.2 (H) 04/08/2021 1440   ALBUMIN 4.9 04/08/2021 1440   AST 22 04/08/2021 1440   ALT 31 04/08/2021 1440   ALKPHOS 88 04/08/2021 1440   BILITOT 0.7 04/08/2021 1440   GFRNONAA >60 04/08/2021 1440   GFRAA >60 08/06/2018 0815       Component Value Date/Time   WBC 9.6 04/08/2021 1440   RBC 5.89 (H) 04/08/2021 1440   HGB 18.5 (H) 04/08/2021 1440   HCT 52.6 (H) 04/08/2021 1440   PLT 276 04/08/2021 1440   MCV 89.3 04/08/2021 1440   MCH 31.4 04/08/2021 1440   MCHC 35.2 04/08/2021 1440   RDW 11.9 04/08/2021 1440   LYMPHSABS 3.1 04/08/2021 1440   MONOABS 0.5 04/08/2021 1440   EOSABS 0.1 04/08/2021 1440   BASOSABS 0.0 04/08/2021 1440    No results found for: "POCLITH", "LITHIUM"    No results found for: "PHENYTOIN", "PHENOBARB", "VALPROATE", "CBMZ"   .res Assessment: Plan:    Plan:  PDMP reviewed  1. Cymbalta 60mg  daily 2. Zolpidem 10mg  at hs - sleep   Will take patient out of work for the next 4 weeks to help with mood stabilization.   Patient advised to contact office with any questions, adverse effects, or acute worsening in signs and symptoms. Patient to call with information regarding LOA.   RTC 4 weeks  There are no diagnoses linked to this encounter.   Please see After Visit Summary for patient specific  instructions.  Future Appointments  Date Time Provider Department Center  03/03/2023  8:20 AM Tayana Shankle, Thereasa Solo, NP [facility] None    No orders of the defined types were placed in this encounter.   -------------------------------

## 2023-03-31 ENCOUNTER — Ambulatory Visit: Payer: 59 | Admitting: Adult Health
